# Patient Record
Sex: Female | Born: 1955
Health system: Southern US, Community
[De-identification: ages and names within clinical notes are randomized; demographics above are authoritative.]

## PROBLEM LIST (undated history)

## (undated) DIAGNOSIS — I10 Essential (primary) hypertension: Secondary | ICD-10-CM

## (undated) DIAGNOSIS — E079 Disorder of thyroid, unspecified: Secondary | ICD-10-CM

## (undated) DIAGNOSIS — K5792 Diverticulitis of intestine, part unspecified, without perforation or abscess without bleeding: Secondary | ICD-10-CM

## (undated) DIAGNOSIS — M1990 Unspecified osteoarthritis, unspecified site: Secondary | ICD-10-CM

## (undated) DIAGNOSIS — IMO0002 Reserved for concepts with insufficient information to code with codable children: Secondary | ICD-10-CM

## (undated) DIAGNOSIS — M8580 Other specified disorders of bone density and structure, unspecified site: Secondary | ICD-10-CM

## (undated) HISTORY — DX: Disorder of thyroid, unspecified: E07.9

## (undated) HISTORY — DX: Unspecified osteoarthritis, unspecified site: M19.90

## (undated) HISTORY — PX: GYNECOLOGIC CRYOSURGERY: SHX857

## (undated) HISTORY — DX: Reserved for concepts with insufficient information to code with codable children: IMO0002

## (undated) HISTORY — DX: Other specified disorders of bone density and structure, unspecified site: M85.80

## (undated) HISTORY — PX: OTHER SURGICAL HISTORY: SHX169

## (undated) HISTORY — PX: BREAST EXCISIONAL BIOPSY: SUR124

## (undated) HISTORY — PX: BREAST BIOPSY: SHX20

## (undated) HISTORY — PX: TUBAL LIGATION: SHX77

---

## 1985-02-28 HISTORY — PX: CERVIX SURGERY: SHX593

## 1993-02-28 HISTORY — PX: HYSTEROSCOPY WITH D & C: SHX1775

## 1993-02-28 HISTORY — PX: VAGINAL HYSTERECTOMY: SUR661

## 1997-11-07 ENCOUNTER — Ambulatory Visit (HOSPITAL_COMMUNITY): Admission: RE | Admit: 1997-11-07 | Discharge: 1997-11-07 | Payer: Self-pay | Admitting: Obstetrics and Gynecology

## 1998-11-23 ENCOUNTER — Ambulatory Visit (HOSPITAL_COMMUNITY): Admission: RE | Admit: 1998-11-23 | Discharge: 1998-11-23 | Payer: Self-pay | Admitting: Obstetrics and Gynecology

## 1998-11-23 ENCOUNTER — Encounter: Payer: Self-pay | Admitting: Obstetrics and Gynecology

## 1999-02-15 ENCOUNTER — Other Ambulatory Visit: Admission: RE | Admit: 1999-02-15 | Discharge: 1999-02-15 | Payer: Self-pay | Admitting: Obstetrics and Gynecology

## 1999-12-15 ENCOUNTER — Ambulatory Visit (HOSPITAL_COMMUNITY): Admission: RE | Admit: 1999-12-15 | Discharge: 1999-12-15 | Payer: Self-pay | Admitting: Obstetrics and Gynecology

## 1999-12-15 ENCOUNTER — Encounter: Payer: Self-pay | Admitting: Obstetrics and Gynecology

## 2000-04-10 ENCOUNTER — Encounter: Admission: RE | Admit: 2000-04-10 | Discharge: 2000-04-10 | Payer: Self-pay | Admitting: Obstetrics and Gynecology

## 2000-04-10 ENCOUNTER — Encounter: Payer: Self-pay | Admitting: Obstetrics and Gynecology

## 2001-02-28 HISTORY — PX: CERVICAL DISCECTOMY: SHX98

## 2001-08-30 ENCOUNTER — Encounter: Payer: Self-pay | Admitting: Internal Medicine

## 2001-08-30 ENCOUNTER — Encounter: Admission: RE | Admit: 2001-08-30 | Discharge: 2001-08-30 | Payer: Self-pay | Admitting: Internal Medicine

## 2002-01-20 ENCOUNTER — Encounter: Payer: Self-pay | Admitting: Emergency Medicine

## 2002-01-20 ENCOUNTER — Emergency Department (HOSPITAL_COMMUNITY): Admission: EM | Admit: 2002-01-20 | Discharge: 2002-01-20 | Payer: Self-pay | Admitting: Emergency Medicine

## 2002-09-09 ENCOUNTER — Encounter: Admission: RE | Admit: 2002-09-09 | Discharge: 2002-09-09 | Payer: Self-pay | Admitting: Internal Medicine

## 2002-09-09 ENCOUNTER — Encounter: Payer: Self-pay | Admitting: Internal Medicine

## 2003-07-17 ENCOUNTER — Other Ambulatory Visit: Admission: RE | Admit: 2003-07-17 | Discharge: 2003-07-17 | Payer: Self-pay | Admitting: Gynecology

## 2003-09-12 ENCOUNTER — Encounter: Admission: RE | Admit: 2003-09-12 | Discharge: 2003-09-12 | Payer: Self-pay | Admitting: Gynecology

## 2004-07-29 ENCOUNTER — Other Ambulatory Visit: Admission: RE | Admit: 2004-07-29 | Discharge: 2004-07-29 | Payer: Self-pay | Admitting: Gynecology

## 2004-11-23 ENCOUNTER — Encounter: Admission: RE | Admit: 2004-11-23 | Discharge: 2004-11-23 | Payer: Self-pay | Admitting: Internal Medicine

## 2005-02-27 ENCOUNTER — Encounter: Admission: RE | Admit: 2005-02-27 | Discharge: 2005-02-27 | Payer: Self-pay | Admitting: Internal Medicine

## 2005-06-29 ENCOUNTER — Encounter: Payer: Self-pay | Admitting: Obstetrics and Gynecology

## 2005-08-24 ENCOUNTER — Other Ambulatory Visit: Admission: RE | Admit: 2005-08-24 | Discharge: 2005-08-24 | Payer: Self-pay | Admitting: Gynecology

## 2006-01-09 ENCOUNTER — Ambulatory Visit (HOSPITAL_COMMUNITY): Admission: RE | Admit: 2006-01-09 | Discharge: 2006-01-09 | Payer: Self-pay | Admitting: *Deleted

## 2006-01-09 ENCOUNTER — Encounter: Admission: RE | Admit: 2006-01-09 | Discharge: 2006-01-09 | Payer: Self-pay | Admitting: Internal Medicine

## 2006-11-02 ENCOUNTER — Other Ambulatory Visit: Admission: RE | Admit: 2006-11-02 | Discharge: 2006-11-02 | Payer: Self-pay | Admitting: Gynecology

## 2007-01-22 ENCOUNTER — Encounter: Admission: RE | Admit: 2007-01-22 | Discharge: 2007-01-22 | Payer: Self-pay | Admitting: Internal Medicine

## 2007-01-30 ENCOUNTER — Encounter: Admission: RE | Admit: 2007-01-30 | Discharge: 2007-01-30 | Payer: Self-pay | Admitting: Internal Medicine

## 2008-05-08 ENCOUNTER — Encounter: Admission: RE | Admit: 2008-05-08 | Discharge: 2008-05-08 | Payer: Self-pay | Admitting: Internal Medicine

## 2009-01-08 ENCOUNTER — Other Ambulatory Visit: Admission: RE | Admit: 2009-01-08 | Discharge: 2009-01-08 | Payer: Self-pay | Admitting: Gynecology

## 2009-01-08 ENCOUNTER — Ambulatory Visit: Payer: Self-pay | Admitting: Gynecology

## 2009-01-08 ENCOUNTER — Encounter: Payer: Self-pay | Admitting: Gynecology

## 2009-01-15 ENCOUNTER — Encounter: Admission: RE | Admit: 2009-01-15 | Discharge: 2009-01-15 | Payer: Self-pay | Admitting: Internal Medicine

## 2010-01-13 ENCOUNTER — Ambulatory Visit: Payer: Self-pay | Admitting: Gynecology

## 2010-01-13 ENCOUNTER — Other Ambulatory Visit: Admission: RE | Admit: 2010-01-13 | Discharge: 2010-01-13 | Payer: Self-pay | Admitting: Gynecology

## 2010-01-20 ENCOUNTER — Encounter: Admission: RE | Admit: 2010-01-20 | Discharge: 2010-01-20 | Payer: Self-pay | Admitting: Gynecology

## 2010-03-20 ENCOUNTER — Encounter: Payer: Self-pay | Admitting: Internal Medicine

## 2010-03-21 ENCOUNTER — Encounter: Payer: Self-pay | Admitting: Internal Medicine

## 2010-07-16 NOTE — Op Note (Signed)
NAMESALENA, ORTLIEB                 ACCOUNT NO.:  [id number]   MEDICAL RECORD NO.:  [id number]          PATIENT TYPE:  AMB   LOCATION:  ENDO                         FACILITY:  [facility]   PHYSICIAN:  Georgiana Spinner, M.D.    DATE OF BIRTH:  Dec 30, 1955   DATE OF PROCEDURE:  DATE OF DISCHARGE:                                 OPERATIVE REPORT   PROCEDURE:  Colonoscopy.   INDICATIONS:  Colon cancer screening.   ANESTHESIA:  Fentanyl 100 mcg, Versed 7 mg.   PROCEDURE:  With the patient mildly sedated in the left lateral decubitus  position, the Olympus videoscopic colonoscope was inserted into the rectum  and passed through a rather tortuous colon to reach the cecum which was  identified by the ileocecal valve and crow's foot of the cecum, both of  which were photographed.  From this point, the colonoscope was slowly  withdrawn, taking circumferential views of the colonic mucosa, stopping only  in the rectum which appeared normal on direct and showed hemorrhoids on  retroflexed view.  The endoscope was straightened and withdrawn.  The  patient's vital signs and pulse oximeter remained stable.  The patient  tolerated procedure well without apparent complications.   FINDINGS:  Internal hemorrhoids.  Otherwise an unremarkable colonoscopic  examination to the cecum.   PLAN:  Consider repeat examination in 5-10 years.           ______________________________  Georgiana Spinner, M.D.     GMO/MEDQ  D:  01/09/2006  T:  01/09/2006  Job:  [id number]

## 2010-08-23 ENCOUNTER — Encounter (INDEPENDENT_AMBULATORY_CARE_PROVIDER_SITE_OTHER): Payer: Self-pay | Admitting: Surgery

## 2010-08-27 ENCOUNTER — Encounter (INDEPENDENT_AMBULATORY_CARE_PROVIDER_SITE_OTHER): Payer: 59 | Admitting: Surgery

## 2010-09-07 ENCOUNTER — Encounter (INDEPENDENT_AMBULATORY_CARE_PROVIDER_SITE_OTHER): Payer: Self-pay | Admitting: Surgery

## 2010-09-08 ENCOUNTER — Encounter (INDEPENDENT_AMBULATORY_CARE_PROVIDER_SITE_OTHER): Payer: Self-pay | Admitting: Surgery

## 2010-09-08 ENCOUNTER — Ambulatory Visit (INDEPENDENT_AMBULATORY_CARE_PROVIDER_SITE_OTHER): Payer: 59 | Admitting: Surgery

## 2010-09-08 DIAGNOSIS — D1739 Benign lipomatous neoplasm of skin and subcutaneous tissue of other sites: Secondary | ICD-10-CM

## 2010-09-08 DIAGNOSIS — D171 Benign lipomatous neoplasm of skin and subcutaneous tissue of trunk: Secondary | ICD-10-CM

## 2010-09-08 NOTE — Progress Notes (Signed)
HPI: The patient is a 55 year old white female who is a patient of Dr. Merri Brunette.  I removed a lipoma from her right back on 05 August 2010. She has done well.   PE: Back: Her incision is well healed without inflammation.  Assessement: #1. Lipoma of right back.  I gave the patient copy of her path report. Her return visit is on a p.r.n. basis.

## 2010-09-08 NOTE — Patient Instructions (Signed)
Has copy of pathology.

## 2010-12-13 ENCOUNTER — Other Ambulatory Visit: Payer: Self-pay | Admitting: Gynecology

## 2010-12-13 DIAGNOSIS — Z1231 Encounter for screening mammogram for malignant neoplasm of breast: Secondary | ICD-10-CM

## 2011-01-13 ENCOUNTER — Other Ambulatory Visit: Payer: Self-pay | Admitting: Orthopedic Surgery

## 2011-01-13 DIAGNOSIS — M4800 Spinal stenosis, site unspecified: Secondary | ICD-10-CM

## 2011-01-13 DIAGNOSIS — M5126 Other intervertebral disc displacement, lumbar region: Secondary | ICD-10-CM

## 2011-01-14 ENCOUNTER — Ambulatory Visit
Admission: RE | Admit: 2011-01-14 | Discharge: 2011-01-14 | Disposition: A | Discharge status: Home / Self Care | Payer: 59 | Source: Ambulatory Visit | Attending: Orthopedic Surgery | Admitting: Orthopedic Surgery

## 2011-01-14 DIAGNOSIS — M4800 Spinal stenosis, site unspecified: Secondary | ICD-10-CM

## 2011-01-14 DIAGNOSIS — M5126 Other intervertebral disc displacement, lumbar region: Secondary | ICD-10-CM

## 2011-01-14 MED ORDER — IOHEXOL 180 MG/ML  SOLN
15.0000 mL | Freq: Once | INTRAMUSCULAR | Status: AC | PRN
Start: 2011-01-14 — End: 2011-01-14

## 2011-01-14 MED ORDER — ONDANSETRON HCL 4 MG/2ML IJ SOLN
4.0000 mg | Freq: Once | INTRAMUSCULAR | Status: AC
  Administered 2011-01-14: 4 mg via INTRAMUSCULAR

## 2011-01-14 MED ORDER — HYDROMORPHONE HCL PF 2 MG/ML IJ SOLN
1.5000 mg | Freq: Once | INTRAMUSCULAR | Status: AC
  Administered 2011-01-14: 1.5 mg via INTRAMUSCULAR

## 2011-01-14 MED ORDER — DIAZEPAM 5 MG PO TABS
10.0000 mg | ORAL_TABLET | Freq: Once | ORAL | Status: AC
  Administered 2011-01-14: 10 mg via ORAL

## 2011-01-14 NOTE — Patient Instructions (Signed)

## 2011-01-25 ENCOUNTER — Ambulatory Visit
Admission: RE | Admit: 2011-01-25 | Discharge: 2011-01-25 | Disposition: A | Discharge status: Home / Self Care | Payer: 59 | Source: Ambulatory Visit | Attending: Gynecology | Admitting: Gynecology

## 2011-01-25 DIAGNOSIS — Z1231 Encounter for screening mammogram for malignant neoplasm of breast: Secondary | ICD-10-CM

## 2011-02-08 ENCOUNTER — Other Ambulatory Visit: Payer: Self-pay | Admitting: Orthopedic Surgery

## 2011-02-08 DIAGNOSIS — IMO0002 Reserved for concepts with insufficient information to code with codable children: Secondary | ICD-10-CM

## 2011-02-15 ENCOUNTER — Ambulatory Visit
Admission: RE | Admit: 2011-02-15 | Discharge: 2011-02-15 | Disposition: A | Discharge status: Home / Self Care | Payer: 59 | Source: Ambulatory Visit | Attending: Orthopedic Surgery | Admitting: Orthopedic Surgery

## 2011-02-15 DIAGNOSIS — IMO0002 Reserved for concepts with insufficient information to code with codable children: Secondary | ICD-10-CM

## 2011-02-15 MED ORDER — IOHEXOL 300 MG/ML  SOLN
100.0000 mL | Freq: Once | INTRAMUSCULAR | Status: AC | PRN
  Administered 2011-02-15: 100 mL via INTRAVENOUS

## 2011-08-16 ENCOUNTER — Encounter: Payer: Self-pay | Admitting: *Deleted

## 2011-08-24 ENCOUNTER — Ambulatory Visit (INDEPENDENT_AMBULATORY_CARE_PROVIDER_SITE_OTHER): Payer: 59 | Admitting: Gynecology

## 2011-08-24 ENCOUNTER — Encounter: Payer: Self-pay | Admitting: Gynecology

## 2011-08-24 VITALS — BP 116/70 | Ht 64.25 in | Wt 134.0 lb

## 2011-08-24 DIAGNOSIS — Z01419 Encounter for gynecological examination (general) (routine) without abnormal findings: Secondary | ICD-10-CM

## 2011-08-24 DIAGNOSIS — N952 Postmenopausal atrophic vaginitis: Secondary | ICD-10-CM

## 2011-08-24 NOTE — Progress Notes (Signed)
Sherry Pierce 10/10/1955 [id number]        56 y.o.  for annual exam.  Doing well without complaints.  Past medical history,surgical history, medications, allergies, family history and social history were all reviewed and documented in the EPIC chart. ROS:  Was performed and pertinent positives and negatives are included in the history.  Exam: Sherrilyn Rist assistant Filed Vitals:   08/24/11 1543  BP: 116/70   General appearance  Normal Skin grossly normal Head/Neck normal with no cervical or supraclavicular adenopathy thyroid normal Lungs  clear Cardiac RR, without RMG Abdominal  soft, nontender, without masses, organomegaly or hernia Breasts  examined lying and sitting without masses, retractions, discharge or axillary adenopathy. Pelvic  Ext/BUS/vagina  normal with atrophic genital changes.  Adnexa  Without masses or tenderness    Anus and perineum  normal   Rectovaginal  normal sphincter tone without palpated masses or tenderness.    Assessment/Plan:  56 y.o. female for annual exam.    1. Atrophic genital changes. Patient's overall he is symptomatic but she is not sexually active. She had questions about becoming such active and I reviewed options to include ERT both systemically as well as topically. The issues of thrombosis with stroke heart attack DVT and breast cancer issues reviewed and the options for vaginal cream with absorption versus Vagifem with limited absorption also discussed. She's not ready to make any decisions at this time and let me know if she wants to consider a trial of vaginal estrogen. She is not having significant hot flushes night sweats or other symptoms. 2. Pap smear. Patient has a history of cryosurgery a number of years ago with negative follow up Pap smears.  She subsequently underwent hysterectomy in 1995 for bleeding. Her last Pap smear was 2011 of the vaginal cuff and she has a number of negative vaginal Pap smears in her chart. I discussed current screening  guidelines and no Pap smear was done today and option to stop screening altogether was reviewed. We'll review this on an annual basis. 3. Mammography. Patient's mammography was November 2012. She'll continue with annual mammography. SBE monthly reviewed. 4. Bone density. She has done through Dr. Carolee Rota office who follows her. 5. Colonoscopy. Patient's up to date and will follow up with their recommended screening interval. 6. Health maintenance. No blood work was done today as it is all done through Dr. Carolee Rota office who sees her routinely for medical follow up. Assuming she continues well from a gynecologic standpoint she will see me in a year, sooner as needed.    Dara Lords MD, 4:11 PM 08/24/2011

## 2011-08-24 NOTE — Patient Instructions (Signed)
Follow up in one year for her annual gynecologic exam. 

## 2012-02-14 ENCOUNTER — Other Ambulatory Visit: Payer: Self-pay | Admitting: Gynecology

## 2012-02-14 DIAGNOSIS — Z1231 Encounter for screening mammogram for malignant neoplasm of breast: Secondary | ICD-10-CM

## 2012-02-16 ENCOUNTER — Ambulatory Visit
Admission: RE | Admit: 2012-02-16 | Discharge: 2012-02-16 | Disposition: A | Discharge status: Home / Self Care | Payer: 59 | Source: Ambulatory Visit | Attending: Gynecology | Admitting: Gynecology

## 2012-02-16 DIAGNOSIS — Z1231 Encounter for screening mammogram for malignant neoplasm of breast: Secondary | ICD-10-CM

## 2013-01-22 ENCOUNTER — Encounter: Payer: Self-pay | Admitting: Internal Medicine

## 2013-02-07 ENCOUNTER — Other Ambulatory Visit: Payer: Self-pay

## 2013-02-07 DIAGNOSIS — Z1231 Encounter for screening mammogram for malignant neoplasm of breast: Secondary | ICD-10-CM

## 2013-02-26 ENCOUNTER — Ambulatory Visit: Payer: 59 | Admitting: Internal Medicine

## 2013-03-04 ENCOUNTER — Other Ambulatory Visit: Payer: Self-pay | Admitting: Internal Medicine

## 2013-03-04 DIAGNOSIS — R1310 Dysphagia, unspecified: Secondary | ICD-10-CM

## 2013-03-05 ENCOUNTER — Ambulatory Visit: Payer: 59

## 2013-03-07 ENCOUNTER — Ambulatory Visit: Payer: 59 | Admitting: Internal Medicine

## 2013-03-18 ENCOUNTER — Ambulatory Visit
Admission: RE | Admit: 2013-03-18 | Discharge: 2013-03-18 | Disposition: A | Discharge status: Home / Self Care | Payer: 59 | Source: Ambulatory Visit | Attending: Internal Medicine | Admitting: Internal Medicine

## 2013-03-18 DIAGNOSIS — R1310 Dysphagia, unspecified: Secondary | ICD-10-CM

## 2013-03-22 ENCOUNTER — Ambulatory Visit
Admission: RE | Admit: 2013-03-22 | Discharge: 2013-03-22 | Disposition: A | Discharge status: Home / Self Care | Payer: 59 | Source: Ambulatory Visit

## 2013-03-22 DIAGNOSIS — Z1231 Encounter for screening mammogram for malignant neoplasm of breast: Secondary | ICD-10-CM

## 2013-12-30 ENCOUNTER — Encounter: Payer: Self-pay | Admitting: Gynecology

## 2014-03-05 ENCOUNTER — Other Ambulatory Visit: Payer: Self-pay

## 2014-03-05 DIAGNOSIS — Z1231 Encounter for screening mammogram for malignant neoplasm of breast: Secondary | ICD-10-CM

## 2014-03-28 ENCOUNTER — Ambulatory Visit: Payer: Self-pay

## 2014-04-04 ENCOUNTER — Ambulatory Visit
Admission: RE | Admit: 2014-04-04 | Discharge: 2014-04-04 | Disposition: A | Discharge status: Home / Self Care | Payer: 59 | Source: Ambulatory Visit

## 2014-04-04 DIAGNOSIS — Z1231 Encounter for screening mammogram for malignant neoplasm of breast: Secondary | ICD-10-CM

## 2015-07-15 ENCOUNTER — Other Ambulatory Visit: Payer: Self-pay

## 2015-07-15 DIAGNOSIS — Z1231 Encounter for screening mammogram for malignant neoplasm of breast: Secondary | ICD-10-CM

## 2015-07-31 ENCOUNTER — Ambulatory Visit
Admission: RE | Admit: 2015-07-31 | Discharge: 2015-07-31 | Disposition: A | Discharge status: Home / Self Care | Payer: 59 | Source: Ambulatory Visit

## 2015-07-31 DIAGNOSIS — Z1231 Encounter for screening mammogram for malignant neoplasm of breast: Secondary | ICD-10-CM

## 2016-05-02 DIAGNOSIS — Z79899 Other long term (current) drug therapy: Secondary | ICD-10-CM | POA: Diagnosis not present

## 2016-05-02 DIAGNOSIS — Z23 Encounter for immunization: Secondary | ICD-10-CM | POA: Diagnosis not present

## 2016-08-12 DIAGNOSIS — Z1211 Encounter for screening for malignant neoplasm of colon: Secondary | ICD-10-CM | POA: Diagnosis not present

## 2016-08-12 DIAGNOSIS — Z1212 Encounter for screening for malignant neoplasm of rectum: Secondary | ICD-10-CM | POA: Diagnosis not present

## 2016-08-23 ENCOUNTER — Encounter: Payer: Self-pay | Admitting: Gastroenterology

## 2016-09-09 DIAGNOSIS — R195 Other fecal abnormalities: Secondary | ICD-10-CM | POA: Diagnosis not present

## 2016-09-16 ENCOUNTER — Other Ambulatory Visit: Payer: Self-pay | Admitting: Internal Medicine

## 2016-09-16 DIAGNOSIS — Z1231 Encounter for screening mammogram for malignant neoplasm of breast: Secondary | ICD-10-CM

## 2016-09-23 DIAGNOSIS — E039 Hypothyroidism, unspecified: Secondary | ICD-10-CM | POA: Diagnosis not present

## 2016-09-23 DIAGNOSIS — Z0000 Encounter for general adult medical examination without abnormal findings: Secondary | ICD-10-CM | POA: Diagnosis not present

## 2016-09-26 ENCOUNTER — Ambulatory Visit
Admission: RE | Admit: 2016-09-26 | Discharge: 2016-09-26 | Disposition: A | Discharge status: Home / Self Care | Payer: 59 | Source: Ambulatory Visit | Attending: Internal Medicine | Admitting: Internal Medicine

## 2016-09-26 ENCOUNTER — Encounter (INDEPENDENT_AMBULATORY_CARE_PROVIDER_SITE_OTHER): Payer: Self-pay

## 2016-09-26 DIAGNOSIS — Z1231 Encounter for screening mammogram for malignant neoplasm of breast: Secondary | ICD-10-CM | POA: Diagnosis not present

## 2016-09-30 DIAGNOSIS — E039 Hypothyroidism, unspecified: Secondary | ICD-10-CM | POA: Diagnosis not present

## 2016-09-30 DIAGNOSIS — M859 Disorder of bone density and structure, unspecified: Secondary | ICD-10-CM | POA: Diagnosis not present

## 2016-09-30 DIAGNOSIS — Z0000 Encounter for general adult medical examination without abnormal findings: Secondary | ICD-10-CM | POA: Diagnosis not present

## 2016-09-30 DIAGNOSIS — M8580 Other specified disorders of bone density and structure, unspecified site: Secondary | ICD-10-CM | POA: Diagnosis not present

## 2016-10-04 DIAGNOSIS — K635 Polyp of colon: Secondary | ICD-10-CM | POA: Diagnosis not present

## 2016-10-04 DIAGNOSIS — R195 Other fecal abnormalities: Secondary | ICD-10-CM | POA: Diagnosis not present

## 2016-10-04 DIAGNOSIS — D126 Benign neoplasm of colon, unspecified: Secondary | ICD-10-CM | POA: Diagnosis not present

## 2016-10-05 DIAGNOSIS — Z23 Encounter for immunization: Secondary | ICD-10-CM | POA: Diagnosis not present

## 2016-10-11 ENCOUNTER — Ambulatory Visit: Payer: 59 | Admitting: Gastroenterology

## 2016-10-24 ENCOUNTER — Ambulatory Visit: Payer: Self-pay | Admitting: Gynecology

## 2016-12-21 ENCOUNTER — Emergency Department (HOSPITAL_COMMUNITY)
Admission: EM | Admit: 2016-12-21 | Discharge: 2016-12-21 | Disposition: A | Discharge status: Home / Self Care | Payer: 59 | Attending: Physician Assistant | Admitting: Physician Assistant

## 2016-12-21 ENCOUNTER — Encounter (HOSPITAL_COMMUNITY): Payer: Self-pay

## 2016-12-21 ENCOUNTER — Emergency Department (HOSPITAL_COMMUNITY): Payer: 59

## 2016-12-21 DIAGNOSIS — W0110XA Fall on same level from slipping, tripping and stumbling with subsequent striking against unspecified object, initial encounter: Secondary | ICD-10-CM | POA: Insufficient documentation

## 2016-12-21 DIAGNOSIS — S52512A Displaced fracture of left radial styloid process, initial encounter for closed fracture: Secondary | ICD-10-CM | POA: Diagnosis not present

## 2016-12-21 DIAGNOSIS — S52572A Other intraarticular fracture of lower end of left radius, initial encounter for closed fracture: Secondary | ICD-10-CM | POA: Diagnosis not present

## 2016-12-21 DIAGNOSIS — Y929 Unspecified place or not applicable: Secondary | ICD-10-CM | POA: Insufficient documentation

## 2016-12-21 DIAGNOSIS — Z79899 Other long term (current) drug therapy: Secondary | ICD-10-CM | POA: Insufficient documentation

## 2016-12-21 DIAGNOSIS — Y999 Unspecified external cause status: Secondary | ICD-10-CM | POA: Insufficient documentation

## 2016-12-21 DIAGNOSIS — Y9301 Activity, walking, marching and hiking: Secondary | ICD-10-CM | POA: Diagnosis not present

## 2016-12-21 DIAGNOSIS — Z87891 Personal history of nicotine dependence: Secondary | ICD-10-CM | POA: Diagnosis not present

## 2016-12-21 DIAGNOSIS — S59902A Unspecified injury of left elbow, initial encounter: Secondary | ICD-10-CM | POA: Diagnosis not present

## 2016-12-21 DIAGNOSIS — S52612A Displaced fracture of left ulna styloid process, initial encounter for closed fracture: Secondary | ICD-10-CM | POA: Diagnosis not present

## 2016-12-21 DIAGNOSIS — S6992XA Unspecified injury of left wrist, hand and finger(s), initial encounter: Secondary | ICD-10-CM | POA: Diagnosis present

## 2016-12-21 MED ORDER — FENTANYL CITRATE (PF) 100 MCG/2ML IJ SOLN
25.0000 ug | Freq: Once | INTRAMUSCULAR | Status: AC
  Administered 2016-12-21: 25 ug via INTRAMUSCULAR

## 2016-12-21 MED ORDER — OXYCODONE-ACETAMINOPHEN 5-325 MG PO TABS: 1.0000 | ORAL_TABLET | Freq: Four times a day (QID) | ORAL | 0 refills | Status: DC | PRN

## 2016-12-21 MED ORDER — FENTANYL CITRATE (PF) 100 MCG/2ML IJ SOLN
25.0000 ug | Freq: Once | INTRAMUSCULAR | Status: DC
  Filled 2016-12-21: qty 2

## 2016-12-21 MED ORDER — TETANUS-DIPHTH-ACELL PERTUSSIS 5-2.5-18.5 LF-MCG/0.5 IM SUSP
0.5000 mL | Freq: Once | INTRAMUSCULAR | Status: DC
  Filled 2016-12-21: qty 0.5

## 2016-12-21 MED ORDER — OXYCODONE-ACETAMINOPHEN 5-325 MG PO TABS
1.0000 | ORAL_TABLET | Freq: Once | ORAL | Status: AC
  Administered 2016-12-21: 1 via ORAL
  Filled 2016-12-21: qty 1

## 2016-12-21 NOTE — ED Triage Notes (Signed)
Pt reports that she was in the yard with her dog on a leash, and dog pulled her she fell on the ground. Pt has limited ROM and deformities noted in wrist region, with left forearm and elbow pain. . Pt states pain 10/10.

## 2016-12-21 NOTE — ED Provider Notes (Signed)
[facility] DEPT Provider Note   CSN: [id number] Arrival date & time: 12/21/16  1637     History   Chief Complaint Chief Complaint  Patient presents with  . Fall  . Arm Injury    HPI Sherry Pierce is a 61 y.o. right handed female presents the emergency department today after a fall.  Patient states that approximately 4 hours ago she was walking her dog who saw another individual and pulled the patient down, causing her to fall with an outstretched hand on left side, landing on the sidewalk.  She denies any head trauma, loss of consciousness.  She has not had any nausea or vomiting since the event.  No alcohol or drug use prior to the event.  She is now having left wrist and forearm pain.  There is an obvious deformity of the left wrist with swelling.  She denies any numbness or tingling.  There is no discoloration of the fingers or hand.  She denies any headache, visual changes, neck pain, shoulder pain.  Patient is not on any anticoagulation medicine.  Tetanus up-to-date.  HPI  Past Medical History:  Diagnosis Date  . Arthritis    lower back  . Degenerative disc disease   . Lipoma    rt side  . Osteopenia   . Thyroid disease     Patient Active Problem List   Diagnosis Date Noted  . Lipoma of flank 09/08/2010    Past Surgical History:  Procedure Laterality Date  . BREAST BIOPSY    . CERVICAL DISCECTOMY  2003  . CERVIX SURGERY  1987  . GYNECOLOGIC CRYOSURGERY    . HYSTEROSCOPY W/D&C  1995  . TUBAL LIGATION    . VAGINAL HYSTERECTOMY  1995   menorrhagia    OB History    Gravida Para Term Preterm AB Living   3 2     1 2    SAB TAB Ectopic Multiple Live Births   1               Home Medications    Prior to Admission medications   Medication Sig Start Date End Date Taking? Authorizing Provider  calcium-vitamin D (OSCAL WITH D) 500-200 MG-UNIT per tablet Take 2 tablets by mouth daily. Calcium Chews +D & K 2 tabs 1200/600mg  Daily      [provider]  ibuprofen (ADVIL,MOTRIN) 800 MG tablet Take 800 mg by mouth every 8 (eight) hours as needed.      [provider]  levothyroxine (SYNTHROID, LEVOTHROID) 100 MCG tablet Take 100 mcg by mouth daily.      [provider]  Pediatric Multivit-Minerals-C (KIDS GUMMY BEAR VITAMINS) CHEW Chew 4-5 each by mouth daily.      [provider]  traMADol (ULTRAM) 50 MG tablet Take 50 mg by mouth every 6 (six) hours as needed.      [provider]    Family History Family History  Problem Relation Age of Onset  . Hypertension Mother   . Rheum arthritis Mother   . Lupus Mother   . Hepatitis Mother        Hep C  . Parkinsonism Father   . Breast cancer Maternal Grandmother   . Cancer Maternal Grandmother        bone    Social History Social History  Substance Use Topics  . Smoking status: Former Research scientist (life sciences)  . Smokeless tobacco: Never Used  . Alcohol use Yes     Comment: not  often     Allergies   Codeine; Doxycycline; Clarithromycin; and Erythrocin   Review of Systems Review of Systems  All other systems reviewed and are negative.    Physical Exam Updated Vital Signs BP 106/86 (BP Location: Right Arm)   Pulse 89   Temp 98.3 F (36.8 C) (Oral)   Resp 20   Ht 5\' 4"  (1.626 m)   Wt 67.1 kg (148 lb)   SpO2 100%   BMI 25.40 kg/m   Physical Exam  Constitutional: She appears well-developed and well-nourished.  HENT:  Head: Normocephalic and atraumatic.  Right Ear: External ear normal.  Left Ear: External ear normal.  Eyes: Conjunctivae are normal. Right eye exhibits no discharge. Left eye exhibits no discharge. No scleral icterus.  Cardiovascular:  Pulses:      Radial pulses are 2+ on the right side, and 2+ on the left side.  Pulmonary/Chest: Effort normal. No respiratory distress.  Musculoskeletal:  Patient's left arm held with elbow in flexion and wrist in flexion.  There is a obvious deformity of the left wrist with  associated swelling.  There is no overlying skin injury.  No tenderness to palpation over the left shoulder.  Gross range of motion intact however limited on exam due to pain of lower arm.  Elbow range of motion full with extension but limited on flexion to 90 degrees.  No tenderness palpation of the radial head, medial epicondyle or lateral epicondyle.  There is tenderness palpation of the radius and ulna of the forearm.  She is unable to perform pronation or supination.  Significant tenderness to palpation over the distal radius and ulna of the wrist.  There is snuffbox tenderness to palpation.  She is able to move all digits grossly without difficulty.  Intact resisted extension of all fingers.  Radial artery 2+.  Refill less than 2 seconds for all fingers.  Neurovascularly intact for radial, ulnar and medial nerve distributions.  No numbness of the lower or upper arm on gross palpation.  Neurological: She is alert.  Speech clear. Follows commands. No facial droop. PERRLA. EOM grossly intact. CN III-XII grossly intact. Grossly moves all extremities without ataxia. Obvious pain to left arm due to injury with movement. Able and appropriate strength for age to upper and lower extremities bilaterally  Skin: No pallor.  Psychiatric: She has a normal mood and affect.  Nursing note and vitals reviewed.    ED Treatments / Results  Labs (all labs ordered are listed, but only abnormal results are displayed) Labs Reviewed - No data to display  EKG  EKG Interpretation None       Radiology No results found.  Procedures Procedures (including critical care time)  Medications Ordered in ED Medications  fentaNYL (SUBLIMAZE) injection 25 mcg (not administered)  Tdap (BOOSTRIX) injection 0.5 mL (not administered)     Initial Impression / Assessment and Plan / ED Course  I have reviewed the triage vital signs and the nursing notes.  Pertinent labs & imaging results that were available during my  care of the patient were reviewed by me and considered in my medical decision making (see chart for details).     61 year old female presents to the emergency department today for left (non-dominant) wrist pain.  This occurred after the patient fell while walking her dog.  There is no evidence of head injury.  There is obvious deformity of wrist on exam.  Patient is neurovascularly intact.  X-rays placed for hand, wrist, forearm, and elbow.  X-ray shows comminuted intra-articular distal left radius fracture with mild displacement and ulnar styloid fracture that is slightly displaced.  I consulted hand surgery and Dr.Coley advised velcro splint.  He stated that he will have his office call the patient in the morning and she should remain NPO after midnight with anticipation of possible surgery tomorrow.  Advised patient that she will need someone to drive her to her appointment as there is a chance she may have surgery tomorrow.  The patient was placed in a wrist splint and tolerated this.  Her pain was controlled in the emergency department.  I provided her with a short course of pain medication at home.  At the time of discharge the patient is neurovascularly intact.  Strict return precautions discussed.  The patient is agreeable with plan and appears safe for discharge.    Final Clinical Impressions(s) / ED Diagnoses   Final diagnoses:  Other closed intra-articular fracture of distal end of left radius, initial encounter  Closed displaced fracture of styloid process of left ulna, initial encounter    New Prescriptions Discharge Medication List as of 12/21/2016  9:30 PM    START taking these medications   Details  oxyCODONE-acetaminophen (PERCOCET/ROXICET) 5-325 MG tablet Take 1 tablet by mouth every 6 (six) hours as needed for severe pain., Starting Wed 12/21/2016, Print         Jillyn Ledger, PA-C 12/21/16 2335    Macarthur Critchley, MD 12/21/16 2352

## 2016-12-21 NOTE — Discharge Instructions (Signed)
Please read and follow all provided instructions.  You have been seen today for fall and wrist pain.   Tests performed today include: An x-ray of the affected area - Comminuted intra-articular distal left radial fracture and ulnar styloid fracture. Vital signs. See below for your results today.   I placed you in a brace/splint.  I spoken with the hand surgeon about your case.  He is aware.  He advised that you do not eat or drink after midnight.  His office will call you around 830 tomorrow morning in order for you to be seen.  You should expect that there is a chance she may have surgery tomorrow so you should have somebody to drive you to and from your appointment  Home care instructions: -- *PRICE in the first 24-48 hours after injury: Protect (with brace, splint, sling), if given by your provider Rest Ice- Do not apply ice pack directly to your skin, place towel or similar between your skin and ice/ice pack. Apply ice for 20 min, then remove for 40 min while awake Compression- Wear brace, elastic bandage, splint as directed by your provider Elevate affected extremity above the level of your heart when not walking around for the first 24-48 hours   For pain control you may take: 800mg  of ibuprofen (that is usually four 200mg  over the counter pills) up to 3 times a day (please take with food) and acetaminophen 975mg  (this is 3 normal strength, 325mg , over the counter pills) up to four times a day. Please do not take more than this. Do not drink alcohol or combine with other medications that have acetaminophen or Ibuprofen as an ingredient (Read the labels!).   For breakthrough pain you may take Percocet (be advised this has tylenol in it, do not exceed above amount of total tylenol in one day). Do not drink alcohol drive or operate heavy machinery when taking. You are being provided a prescription for opiates (also known as narcotics) for pain control on an ?as needed? basis.  Opiates can be  addictive and should only be used when absolutely necessary for pain control when other alternatives do not work.  We recommend you only use them for the recommended amount of time and only as prescribed.  Please do not take with other sedative medications or alcohol.  Please do not drive, operate machinery, or make important decisions while taking opiates.  Please note that these medications can be addictive and have high abuse potential.  Please keep these medications locked away from children, teenagers or any family members with history of substance abuse. Additionally, these medications may cause constipation - take over the counter stool softeners or add fiber to your diet to treat this (Metamucil, Psyllium Fiber, Colace, Miralax) Further refills will need to be obtained from your primary care doctor and will not be prescribed through the Emergency Department. You will test positive on most drug tests while taking this medication.    Return instructions:  Please return if your toes or feet are numb or tingling, appear gray or blue, or you have severe pain (also elevate the leg and loosen splint or wrap if you were given one) Please return to the Emergency Department if you experience worsening symptoms.  Please return if you have any other emergent concerns. Additional Information:  Your vital signs today were: BP 126/83    Pulse 86    Temp 98.3 F (36.8 C) (Oral)    Resp 20    Ht 5\' 4"  (  1.626 m)    Wt 67.1 kg (148 lb)    SpO2 95%    BMI 25.40 kg/m  If your blood pressure (BP) was elevated above 135/85 this visit, please have this repeated by your doctor within one month. ---------------

## 2016-12-22 DIAGNOSIS — S52532B Colles' fracture of left radius, initial encounter for open fracture type I or II: Secondary | ICD-10-CM | POA: Diagnosis not present

## 2016-12-22 DIAGNOSIS — M25532 Pain in left wrist: Secondary | ICD-10-CM | POA: Diagnosis not present

## 2016-12-22 DIAGNOSIS — S52532A Colles' fracture of left radius, initial encounter for closed fracture: Secondary | ICD-10-CM | POA: Diagnosis not present

## 2017-01-04 DIAGNOSIS — S52532D Colles' fracture of left radius, subsequent encounter for closed fracture with routine healing: Secondary | ICD-10-CM | POA: Diagnosis not present

## 2017-01-16 DIAGNOSIS — S52532D Colles' fracture of left radius, subsequent encounter for closed fracture with routine healing: Secondary | ICD-10-CM | POA: Diagnosis not present

## 2017-02-15 DIAGNOSIS — S52532D Colles' fracture of left radius, subsequent encounter for closed fracture with routine healing: Secondary | ICD-10-CM | POA: Diagnosis not present

## 2017-05-16 DIAGNOSIS — R030 Elevated blood-pressure reading, without diagnosis of hypertension: Secondary | ICD-10-CM | POA: Diagnosis not present

## 2017-08-24 ENCOUNTER — Other Ambulatory Visit: Payer: Self-pay | Admitting: Internal Medicine

## 2017-08-24 DIAGNOSIS — Z1231 Encounter for screening mammogram for malignant neoplasm of breast: Secondary | ICD-10-CM

## 2017-09-08 DIAGNOSIS — M2550 Pain in unspecified joint: Secondary | ICD-10-CM | POA: Diagnosis not present

## 2017-10-02 DIAGNOSIS — E039 Hypothyroidism, unspecified: Secondary | ICD-10-CM | POA: Diagnosis not present

## 2017-10-02 DIAGNOSIS — Z0000 Encounter for general adult medical examination without abnormal findings: Secondary | ICD-10-CM | POA: Diagnosis not present

## 2017-10-13 ENCOUNTER — Ambulatory Visit
Admission: RE | Admit: 2017-10-13 | Discharge: 2017-10-13 | Disposition: A | Discharge status: Home / Self Care | Payer: 59 | Source: Ambulatory Visit | Attending: Internal Medicine | Admitting: Internal Medicine

## 2017-10-13 DIAGNOSIS — Z1231 Encounter for screening mammogram for malignant neoplasm of breast: Secondary | ICD-10-CM

## 2017-10-25 DIAGNOSIS — Z23 Encounter for immunization: Secondary | ICD-10-CM | POA: Diagnosis not present

## 2017-10-25 DIAGNOSIS — Z0000 Encounter for general adult medical examination without abnormal findings: Secondary | ICD-10-CM | POA: Diagnosis not present

## 2018-05-03 DIAGNOSIS — Z23 Encounter for immunization: Secondary | ICD-10-CM | POA: Diagnosis not present

## 2018-05-03 DIAGNOSIS — F988 Other specified behavioral and emotional disorders with onset usually occurring in childhood and adolescence: Secondary | ICD-10-CM | POA: Diagnosis not present

## 2018-05-03 DIAGNOSIS — R05 Cough: Secondary | ICD-10-CM | POA: Diagnosis not present

## 2018-05-03 DIAGNOSIS — Z6827 Body mass index (BMI) 27.0-27.9, adult: Secondary | ICD-10-CM | POA: Diagnosis not present

## 2018-08-01 DIAGNOSIS — Z23 Encounter for immunization: Secondary | ICD-10-CM | POA: Diagnosis not present

## 2018-08-01 DIAGNOSIS — F988 Other specified behavioral and emotional disorders with onset usually occurring in childhood and adolescence: Secondary | ICD-10-CM | POA: Diagnosis not present

## 2018-08-02 DIAGNOSIS — Z20828 Contact with and (suspected) exposure to other viral communicable diseases: Secondary | ICD-10-CM | POA: Diagnosis not present

## 2018-08-07 DIAGNOSIS — B029 Zoster without complications: Secondary | ICD-10-CM | POA: Diagnosis not present

## 2018-09-04 DIAGNOSIS — M25561 Pain in right knee: Secondary | ICD-10-CM | POA: Diagnosis not present

## 2018-09-04 DIAGNOSIS — M25562 Pain in left knee: Secondary | ICD-10-CM | POA: Diagnosis not present

## 2018-09-12 DIAGNOSIS — M25561 Pain in right knee: Secondary | ICD-10-CM | POA: Diagnosis not present

## 2018-09-17 DIAGNOSIS — M25561 Pain in right knee: Secondary | ICD-10-CM | POA: Diagnosis not present

## 2018-09-17 DIAGNOSIS — S83231A Complex tear of medial meniscus, current injury, right knee, initial encounter: Secondary | ICD-10-CM | POA: Diagnosis not present

## 2018-10-18 DIAGNOSIS — M1711 Unilateral primary osteoarthritis, right knee: Secondary | ICD-10-CM | POA: Diagnosis not present

## 2018-10-18 DIAGNOSIS — M25561 Pain in right knee: Secondary | ICD-10-CM | POA: Diagnosis not present

## 2018-10-18 DIAGNOSIS — S83241A Other tear of medial meniscus, current injury, right knee, initial encounter: Secondary | ICD-10-CM | POA: Diagnosis not present

## 2018-10-18 DIAGNOSIS — S83281A Other tear of lateral meniscus, current injury, right knee, initial encounter: Secondary | ICD-10-CM | POA: Diagnosis not present

## 2018-10-25 DIAGNOSIS — M8589 Other specified disorders of bone density and structure, multiple sites: Secondary | ICD-10-CM | POA: Diagnosis not present

## 2018-10-25 DIAGNOSIS — E039 Hypothyroidism, unspecified: Secondary | ICD-10-CM | POA: Diagnosis not present

## 2018-10-25 DIAGNOSIS — E559 Vitamin D deficiency, unspecified: Secondary | ICD-10-CM | POA: Diagnosis not present

## 2018-10-25 DIAGNOSIS — Z0000 Encounter for general adult medical examination without abnormal findings: Secondary | ICD-10-CM | POA: Diagnosis not present

## 2018-10-30 DIAGNOSIS — Z780 Asymptomatic menopausal state: Secondary | ICD-10-CM | POA: Diagnosis not present

## 2018-10-30 DIAGNOSIS — Z23 Encounter for immunization: Secondary | ICD-10-CM | POA: Diagnosis not present

## 2018-10-30 DIAGNOSIS — F988 Other specified behavioral and emotional disorders with onset usually occurring in childhood and adolescence: Secondary | ICD-10-CM | POA: Diagnosis not present

## 2018-10-30 DIAGNOSIS — M8580 Other specified disorders of bone density and structure, unspecified site: Secondary | ICD-10-CM | POA: Diagnosis not present

## 2018-10-30 DIAGNOSIS — Z01818 Encounter for other preprocedural examination: Secondary | ICD-10-CM | POA: Diagnosis not present

## 2018-10-30 DIAGNOSIS — E039 Hypothyroidism, unspecified: Secondary | ICD-10-CM | POA: Diagnosis not present

## 2018-10-30 HISTORY — PX: OTHER SURGICAL HISTORY: SHX169

## 2018-11-06 ENCOUNTER — Other Ambulatory Visit: Payer: Self-pay | Admitting: Internal Medicine

## 2018-11-06 DIAGNOSIS — Z1231 Encounter for screening mammogram for malignant neoplasm of breast: Secondary | ICD-10-CM

## 2018-11-07 ENCOUNTER — Ambulatory Visit
Admission: RE | Admit: 2018-11-07 | Discharge: 2018-11-07 | Disposition: A | Discharge status: Home / Self Care | Payer: BC Managed Care – PPO | Source: Ambulatory Visit | Attending: Internal Medicine | Admitting: Internal Medicine

## 2018-11-07 ENCOUNTER — Other Ambulatory Visit: Payer: Self-pay

## 2018-11-07 DIAGNOSIS — Z1231 Encounter for screening mammogram for malignant neoplasm of breast: Secondary | ICD-10-CM

## 2018-11-12 ENCOUNTER — Other Ambulatory Visit: Payer: Self-pay

## 2018-11-13 ENCOUNTER — Ambulatory Visit (INDEPENDENT_AMBULATORY_CARE_PROVIDER_SITE_OTHER): Payer: BC Managed Care – PPO | Admitting: Gynecology

## 2018-11-13 ENCOUNTER — Encounter: Payer: Self-pay | Admitting: Gynecology

## 2018-11-13 VITALS — BP 120/76 | Ht 64.0 in | Wt 152.0 lb

## 2018-11-13 DIAGNOSIS — N952 Postmenopausal atrophic vaginitis: Secondary | ICD-10-CM

## 2018-11-13 DIAGNOSIS — Z01419 Encounter for gynecological examination (general) (routine) without abnormal findings: Secondary | ICD-10-CM | POA: Diagnosis not present

## 2018-11-13 NOTE — Addendum Note (Signed)
Addended by: Nelva Nay on: 11/13/2018 12:02 PM   Modules accepted: Orders

## 2018-11-13 NOTE — Progress Notes (Signed)
    Sherry Pierce 10/31/1955 [id number]        63 y.o.  [id number] for annual gynecologic exam.  Has not been in the office for a number of years.  Without gynecologic complaints.  Past medical history,surgical history, problem list, medications, allergies, family history and social history were all reviewed and documented as reviewed in the EPIC chart.  ROS:  Performed with pertinent positives and negatives included in the history, assessment and plan.   Additional significant findings : None   Exam: Caryn Bee assistant Vitals:   11/13/18 1128  BP: 120/76  Weight: 152 lb (68.9 kg)  Height: 5\' 4"  (1.626 m)   Body mass index is 26.09 kg/m.  General appearance:  Normal affect, orientation and appearance. Skin: Grossly normal HEENT: Without gross lesions.  No cervical or supraclavicular adenopathy. Thyroid normal.  Lungs:  Clear without wheezing, rales or rhonchi Cardiac: RR, without RMG Abdominal:  Soft, nontender, without masses, guarding, rebound, organomegaly or hernia Breasts:  Examined lying and sitting without masses, retractions, discharge or axillary adenopathy. Pelvic:  Ext, BUS, Vagina: With atrophic changes.  Pap smear of vaginal cuff done  Adnexa: Without masses or tenderness    Anus and perineum: Normal   Rectovaginal: Normal sphincter tone without palpated masses or tenderness.    Assessment/Plan:  63 y.o. [id number] female for annual gynecologic exam.  Status post TVH for bleeding in the past.  1. Postmenopausal.  Without significant menopausal symptoms. 2. Pap smear 2011.  Pap smear done today.  History of cryosurgery a number of years ago options to stop screening per current screening guidelines based on hysterectomy history reviewed.  Will readdress on an annual basis. 3. Mammography last week.  Breast exam normal today. 4. DEXA 2020 through Dr. Pennie Banter office.  We will continue to follow-up with them in reference to bone health. 5. Colonoscopy 2017.  Repeat  at their recommended interval. 6. Health maintenance.  No routine lab work done as patient does this through Dr. Pennie Banter office.  Follow-up 1 year, sooner as needed.   Anastasio Auerbach MD, 11:52 AM 11/13/2018

## 2018-11-13 NOTE — Patient Instructions (Signed)
Follow-up in 1 year for annual exam 

## 2018-11-14 DIAGNOSIS — S83241A Other tear of medial meniscus, current injury, right knee, initial encounter: Secondary | ICD-10-CM | POA: Diagnosis not present

## 2018-11-14 DIAGNOSIS — S83281A Other tear of lateral meniscus, current injury, right knee, initial encounter: Secondary | ICD-10-CM | POA: Diagnosis not present

## 2018-11-14 DIAGNOSIS — M23200 Derangement of unspecified lateral meniscus due to old tear or injury, right knee: Secondary | ICD-10-CM | POA: Diagnosis not present

## 2018-11-14 DIAGNOSIS — M23221 Derangement of posterior horn of medial meniscus due to old tear or injury, right knee: Secondary | ICD-10-CM | POA: Diagnosis not present

## 2018-11-14 DIAGNOSIS — M94261 Chondromalacia, right knee: Secondary | ICD-10-CM | POA: Diagnosis not present

## 2018-11-14 LAB — PAP IG W/ RFLX HPV ASCU

## 2018-11-27 ENCOUNTER — Encounter: Payer: Self-pay | Admitting: Gynecology

## 2018-12-19 DIAGNOSIS — R110 Nausea: Secondary | ICD-10-CM | POA: Diagnosis not present

## 2018-12-19 DIAGNOSIS — K5732 Diverticulitis of large intestine without perforation or abscess without bleeding: Secondary | ICD-10-CM | POA: Diagnosis not present

## 2018-12-19 DIAGNOSIS — R1084 Generalized abdominal pain: Secondary | ICD-10-CM | POA: Diagnosis not present

## 2018-12-19 DIAGNOSIS — I10 Essential (primary) hypertension: Secondary | ICD-10-CM | POA: Diagnosis not present

## 2018-12-19 DIAGNOSIS — Z881 Allergy status to other antibiotic agents status: Secondary | ICD-10-CM | POA: Diagnosis not present

## 2018-12-19 DIAGNOSIS — Z882 Allergy status to sulfonamides status: Secondary | ICD-10-CM | POA: Diagnosis not present

## 2018-12-20 DIAGNOSIS — K5732 Diverticulitis of large intestine without perforation or abscess without bleeding: Secondary | ICD-10-CM | POA: Diagnosis not present

## 2019-01-01 DIAGNOSIS — M25561 Pain in right knee: Secondary | ICD-10-CM | POA: Diagnosis not present

## 2019-01-01 DIAGNOSIS — M25661 Stiffness of right knee, not elsewhere classified: Secondary | ICD-10-CM | POA: Diagnosis not present

## 2019-01-03 DIAGNOSIS — M25561 Pain in right knee: Secondary | ICD-10-CM | POA: Diagnosis not present

## 2019-01-03 DIAGNOSIS — M25661 Stiffness of right knee, not elsewhere classified: Secondary | ICD-10-CM | POA: Diagnosis not present

## 2019-01-08 DIAGNOSIS — M25561 Pain in right knee: Secondary | ICD-10-CM | POA: Diagnosis not present

## 2019-01-08 DIAGNOSIS — M25661 Stiffness of right knee, not elsewhere classified: Secondary | ICD-10-CM | POA: Diagnosis not present

## 2019-01-11 DIAGNOSIS — M25561 Pain in right knee: Secondary | ICD-10-CM | POA: Diagnosis not present

## 2019-01-11 DIAGNOSIS — M25661 Stiffness of right knee, not elsewhere classified: Secondary | ICD-10-CM | POA: Diagnosis not present

## 2019-01-16 DIAGNOSIS — M25561 Pain in right knee: Secondary | ICD-10-CM | POA: Diagnosis not present

## 2019-01-16 DIAGNOSIS — M25661 Stiffness of right knee, not elsewhere classified: Secondary | ICD-10-CM | POA: Diagnosis not present

## 2019-01-22 ENCOUNTER — Other Ambulatory Visit: Payer: Self-pay | Admitting: Orthopedic Surgery

## 2019-01-22 ENCOUNTER — Telehealth: Payer: Self-pay | Admitting: Nurse Practitioner

## 2019-01-22 DIAGNOSIS — M25661 Stiffness of right knee, not elsewhere classified: Secondary | ICD-10-CM | POA: Diagnosis not present

## 2019-01-22 DIAGNOSIS — M545 Low back pain: Secondary | ICD-10-CM | POA: Diagnosis not present

## 2019-01-22 DIAGNOSIS — M25561 Pain in right knee: Secondary | ICD-10-CM | POA: Diagnosis not present

## 2019-01-22 DIAGNOSIS — G8929 Other chronic pain: Secondary | ICD-10-CM

## 2019-01-22 DIAGNOSIS — Z5189 Encounter for other specified aftercare: Secondary | ICD-10-CM | POA: Diagnosis not present

## 2019-01-22 NOTE — Telephone Encounter (Signed)
Phone call to patient to verify medication list and allergies for myelogram procedure. Pt instructed to hold vyvanse for 48hrs prior to myelogram appointment time. Pt verbalized understanding. Pre and post procedure instructions reviewed with pt.

## 2019-01-31 ENCOUNTER — Other Ambulatory Visit: Payer: Self-pay

## 2019-01-31 ENCOUNTER — Ambulatory Visit
Admission: RE | Admit: 2019-01-31 | Discharge: 2019-01-31 | Disposition: A | Discharge status: Home / Self Care | Payer: BC Managed Care – PPO | Source: Ambulatory Visit | Attending: Orthopedic Surgery | Admitting: Orthopedic Surgery

## 2019-01-31 DIAGNOSIS — M545 Low back pain, unspecified: Secondary | ICD-10-CM

## 2019-01-31 DIAGNOSIS — G8929 Other chronic pain: Secondary | ICD-10-CM

## 2019-01-31 DIAGNOSIS — M5126 Other intervertebral disc displacement, lumbar region: Secondary | ICD-10-CM | POA: Diagnosis not present

## 2019-01-31 MED ORDER — IOPAMIDOL (ISOVUE-M 200) INJECTION 41%
18.0000 mL | Freq: Once | INTRAMUSCULAR | Status: AC
  Administered 2019-01-31: 18 mL via INTRATHECAL

## 2019-01-31 MED ORDER — DIAZEPAM 5 MG PO TABS
5.0000 mg | ORAL_TABLET | Freq: Once | ORAL | Status: AC
  Administered 2019-01-31: 5 mg via ORAL

## 2019-01-31 NOTE — Discharge Instructions (Signed)
Myelogram Discharge Instructions  1. Go home and rest quietly for the next 24 hours.  It is important to lie flat for the next 24 hours.  Get up only to go to the restroom.  You may lie in the bed or on a couch on your back, your stomach, your left side or your right side.  You may have one pillow under your head.  You may have pillows between your knees while you are on your side or under your knees while you are on your back.  2. DO NOT drive today.  Recline the seat as far back as it will go, while still wearing your seat belt, on the way home.  3. You may get up to go to the bathroom as needed.  You may sit up for 10 minutes to eat.  You may resume your normal diet and medications unless otherwise indicated.  Drink lots of extra fluids today and tomorrow.  4. The incidence of headache, nausea, or vomiting is about 5% (one in 20 patients).  If you develop a headache, lie flat and drink plenty of fluids until the headache goes away.  Caffeinated beverages may be helpful.  If you develop severe nausea and vomiting or a headache that does not go away with flat bed rest, call [phone].  5. You may resume normal activities after your 24 hours of bed rest is over; however, do not exert yourself strongly or do any heavy lifting tomorrow. If when you get up you have a headache when standing, go back to bed and force fluids for another 24 hours.  6. Call your physician for a follow-up appointment.  The results of your myelogram will be sent directly to your physician by the following day.  7. If you have any questions or if complications develop after you arrive home, please call [phone].  Discharge instructions have been explained to the patient.  The patient, or the person responsible for the patient, fully understands these instructions.  YOU MAY RESTART YOUR VYVANSE TOMORROW 02/01/2019 AT 09:30AM.

## 2019-02-01 DIAGNOSIS — M21371 Foot drop, right foot: Secondary | ICD-10-CM | POA: Diagnosis not present

## 2019-02-01 DIAGNOSIS — M545 Low back pain: Secondary | ICD-10-CM | POA: Diagnosis not present

## 2019-02-01 DIAGNOSIS — M25561 Pain in right knee: Secondary | ICD-10-CM | POA: Diagnosis not present

## 2019-02-02 DIAGNOSIS — M545 Low back pain: Secondary | ICD-10-CM | POA: Diagnosis not present

## 2019-02-05 DIAGNOSIS — M5416 Radiculopathy, lumbar region: Secondary | ICD-10-CM | POA: Diagnosis not present

## 2019-02-05 DIAGNOSIS — M545 Low back pain: Secondary | ICD-10-CM | POA: Diagnosis not present

## 2019-02-05 DIAGNOSIS — M4180 Other forms of scoliosis, site unspecified: Secondary | ICD-10-CM | POA: Diagnosis not present

## 2019-02-06 DIAGNOSIS — Z01818 Encounter for other preprocedural examination: Secondary | ICD-10-CM | POA: Diagnosis not present

## 2019-02-11 ENCOUNTER — Ambulatory Visit: Payer: Self-pay | Admitting: Orthopedic Surgery

## 2019-02-18 NOTE — Pre-Procedure Instructions (Signed)
Sherry Pierce  02/18/2019      [place] [address] [place] [address] [place] [address] Phone: [phone] Fax: [phone]    Your procedure is scheduled on February 27, 2019.  Report to [facility] Entrance "A" at 900 AM.  Call this number if you have problems the morning of surgery:  [phone]   Remember:  Do not eat or drink after midnight.    Take these medicines the morning of surgery with A SIP OF WATER  Gabapentin (Neurontin) Levothyroxine (Synthroid)  Do Not take Vyvanse the day of surgery  7 days prior to surgery STOP taking any Aspirin (unless otherwise instructed by your surgeon), Aleve, Naproxen, Ibuprofen, Motrin, Advil, Goody's, BC's, all herbal medications, fish oil, and all vitamins    Day of surgery:  Do not wear jewelry, make-up or nail polish.  Do not wear lotions, powders, or perfumes, or deodorant.  Do not shave 48 hours prior to surgery.    Do not bring valuables to the hospital.  [facility] is not responsible for any belongings or valuables.  Contacts, dentures or bridgework may not be worn into surgery.  Leave your suitcase in the car.  After surgery it may be brought to your room.  For patients admitted to the hospital, discharge time will be determined by your treatment team.  Patients discharged the day of surgery will not be allowed to drive home.    [place]- Preparing For Surgery  Before surgery, you can play an important role. Because skin is not sterile, your skin needs to be as free of germs as possible. You can reduce the number of germs on your skin by washing with CHG (chlorahexidine gluconate) Soap before surgery.  CHG is an antiseptic cleaner which kills germs and bonds with the skin to continue killing germs even after washing.    Oral Hygiene is also important to reduce your risk of infection.  Remember - BRUSH YOUR TEETH THE MORNING OF SURGERY WITH YOUR  REGULAR TOOTHPASTE  Please do not use if you have an allergy to CHG or antibacterial soaps. If your skin becomes reddened/irritated stop using the CHG.  Do not shave (including legs and underarms) for at least 48 hours prior to first CHG shower. It is OK to shave your face.  Please follow these instructions carefully.   1. Shower the NIGHT BEFORE SURGERY and the MORNING OF SURGERY with CHG.   2. If you chose to wash your hair, wash your hair first as usual with your normal shampoo.  3. After you shampoo, rinse your hair and body thoroughly to remove the shampoo.  4. Use CHG as you would any other liquid soap. You can apply CHG directly to the skin and wash gently with a scrungie or a clean washcloth.   5. Apply the CHG Soap to your body ONLY FROM THE NECK DOWN.  Do not use on open wounds or open sores. Avoid contact with your eyes, ears, mouth and genitals (private parts). Wash Face and genitals (private parts)  with your normal soap.  6. Wash thoroughly, paying special attention to the area where your surgery will be performed.  7. Thoroughly rinse your body with warm water from the neck down.  8. DO NOT shower/wash with your normal soap after using and rinsing off the CHG Soap.  9. Pat yourself dry with a CLEAN TOWEL.  10. Wear CLEAN PAJAMAS to bed  the night before surgery, wear comfortable clothes the morning of surgery  11. Place CLEAN SHEETS on your bed the night of your first shower and DO NOT SLEEP WITH PETS.  Day of Surgery: Shower as above Do not apply any deodorants/lotions.  Please wear clean clothes to the hospital/surgery center.   Remember to brush your teeth WITH YOUR REGULAR TOOTHPASTE.   Please read over the following fact sheets that you were given.

## 2019-02-19 ENCOUNTER — Ambulatory Visit (HOSPITAL_COMMUNITY)
Admission: RE | Admit: 2019-02-19 | Discharge: 2019-02-19 | Disposition: A | Discharge status: Home / Self Care | Payer: BC Managed Care – PPO | Source: Ambulatory Visit | Attending: Orthopedic Surgery | Admitting: Orthopedic Surgery

## 2019-02-19 ENCOUNTER — Encounter (HOSPITAL_COMMUNITY)
Admission: RE | Admit: 2019-02-19 | Discharge: 2019-02-19 | Disposition: A | Discharge status: Home / Self Care | Payer: BC Managed Care – PPO | Source: Ambulatory Visit | Attending: Orthopedic Surgery | Admitting: Orthopedic Surgery

## 2019-02-19 ENCOUNTER — Ambulatory Visit: Payer: Self-pay | Admitting: Orthopedic Surgery

## 2019-02-19 ENCOUNTER — Encounter (HOSPITAL_COMMUNITY): Payer: Self-pay

## 2019-02-19 ENCOUNTER — Other Ambulatory Visit: Payer: Self-pay

## 2019-02-19 DIAGNOSIS — Z01818 Encounter for other preprocedural examination: Secondary | ICD-10-CM

## 2019-02-19 DIAGNOSIS — M5416 Radiculopathy, lumbar region: Secondary | ICD-10-CM | POA: Diagnosis not present

## 2019-02-19 HISTORY — DX: Essential (primary) hypertension: I10

## 2019-02-19 HISTORY — DX: Diverticulitis of intestine, part unspecified, without perforation or abscess without bleeding: K57.92

## 2019-02-19 LAB — CBC
HCT: 44.6 % (ref 36.0–46.0)
Hemoglobin: 14.5 g/dL (ref 12.0–15.0)
MCH: 29.1 pg (ref 26.0–34.0)
MCHC: 32.5 g/dL (ref 30.0–36.0)
MCV: 89.4 fL (ref 80.0–100.0)
Platelets: 324 10*3/uL (ref 150–400)
RBC: 4.99 MIL/uL (ref 3.87–5.11)
RDW: 13.2 % (ref 11.5–15.5)
WBC: 5.6 10*3/uL (ref 4.0–10.5)
nRBC: 0 % (ref 0.0–0.2)

## 2019-02-19 LAB — BASIC METABOLIC PANEL
Anion gap: 7 (ref 5–15)
BUN: 14 mg/dL (ref 8–23)
CO2: 28 mmol/L (ref 22–32)
Calcium: 9.5 mg/dL (ref 8.9–10.3)
Chloride: 105 mmol/L (ref 98–111)
Creatinine, Ser: 0.77 mg/dL (ref 0.44–1.00)
GFR calc Af Amer: >60 mL/min (ref >60)
GFR calc non Af Amer: >60 mL/min (ref >60)
Glucose, Bld: 76 mg/dL (ref 70–99)
Potassium: 3.7 mmol/L (ref 3.5–5.1)
Sodium: 140 mmol/L (ref 135–145)

## 2019-02-19 LAB — URINALYSIS, ROUTINE W REFLEX MICROSCOPIC
Bilirubin Urine: NEGATIVE
Glucose, UA: NEGATIVE mg/dL
Hgb urine dipstick: NEGATIVE
Ketones, ur: NEGATIVE mg/dL
Leukocytes,Ua: NEGATIVE
Nitrite: NEGATIVE
Protein, ur: NEGATIVE mg/dL
Specific Gravity, Urine: 1.016 (ref 1.005–1.030)
pH: 8 (ref 5.0–8.0)

## 2019-02-19 LAB — SURGICAL PCR SCREEN
MRSA, PCR: NEGATIVE
Staphylococcus aureus: POSITIVE — AB

## 2019-02-19 LAB — PROTIME-INR
INR: 1 (ref 0.8–1.2)
Prothrombin Time: 12.7 seconds (ref 11.4–15.2)

## 2019-02-19 LAB — APTT: aPTT: 34 seconds (ref 24–36)

## 2019-02-19 NOTE — Progress Notes (Addendum)
PCP - [name], MD Cardiologist - pt denies  Chest x-ray - 02/19/2019 at PAT appt EKG - 12/20/2018 requested from [facility]- in [place] REP Notified- n/a  Stress Test - pt denies ECHO -  Pt denies  Cardiac Cath - pt denies  Sleep Study - pt denies CPAP - n/a  Fasting Blood Sugar - n/a Checks Blood Sugar _____ times a day-n/a  Blood Thinner Instructions: n/a Aspirin Instructions: n/a  ERAS Protocol- n/a Ensure pre-surgery drink or water given- n/a  COVID testing- 02/25/2019  Anesthesia review: Yes-per order and  requested EKG  Patient denies shortness of breath, fever, cough and chest pain at PAT appointment  Patient verbalized understanding of instructions that were given to them at the PAT appointment. Patient was also instructed that they will need to review over the PAT instructions again at home before surgery.

## 2019-02-19 NOTE — H&P (Signed)
Subjective:   Patient has had long-standing back pain which is not significantly changed. She states that she can manage her chronic low back pain but the new neuropathic leg pain is quite severe. She states that she feels as though she wants the leg amputated because of the radicular pain. Imaging studies clearly identify a new foraminal disc herniation on the right side at L4-5. We have briefly discussed selective nerve root block as a possible conservative treatment modalities but she does not want to entertain injection therapy. She prefers to move forward to surgery. She is scheduled for Right L4-5 Foraminal Discectomy 02/27/19 at [facility].  Patient Active Problem List   Diagnosis Date Noted  . Lipoma of flank 09/08/2010   Past Medical History:  Diagnosis Date  . Arthritis    lower back  . Degenerative disc disease   . Diverticulitis   . Hypertension   . Lipoma    rt side  . Osteopenia   . Thyroid disease     Past Surgical History:  Procedure Laterality Date  . Arm fracture    . arthroscopy right knee  10/2018  . BREAST BIOPSY    . CERVICAL DISCECTOMY  2003  . CERVIX SURGERY  1987  . GYNECOLOGIC CRYOSURGERY    . HYSTEROSCOPY WITH D & C  1995  . TUBAL LIGATION    . VAGINAL HYSTERECTOMY  1995   menorrhagia    Current Outpatient Medications  Medication Sig Dispense Refill Last Dose  . cholecalciferol (VITAMIN D3) 25 MCG (1000 UT) tablet Take 1,000 Units by mouth daily.     Marland Kitchen gabapentin (NEURONTIN) 300 MG capsule Take 300 mg by mouth daily as needed for pain.     Marland Kitchen ibuprofen (ADVIL,MOTRIN) 800 MG tablet Take 800 mg by mouth every 8 (eight) hours as needed for moderate pain.      Marland Kitchen levothyroxine (SYNTHROID, LEVOTHROID) 100 MCG tablet Take 100 mcg by mouth daily before breakfast.      . lisdexamfetamine (VYVANSE) 60 MG capsule Take 60 mg by mouth every morning.     Marland Kitchen PENNSAID 2 % SOLN Apply 1 application topically daily as needed for pain.     Marland Kitchen triamterene-hydrochlorothiazide  (MAXZIDE-25) 37.5-25 MG tablet Take 1 tablet by mouth daily.       No current facility-administered medications for this visit.   Allergies  Allergen Reactions  . Doxycycline Anaphylaxis  . Ciprofloxacin Nausea And Vomiting  . Codeine Nausea Only  . Metronidazole Nausea And Vomiting  . Clarithromycin Nausea Only  . Erythrocin Nausea Only    Social History   Tobacco Use  . Smoking status: Former Research scientist (life sciences)  . Smokeless tobacco: Never Used  Substance Use Topics  . Alcohol use: Yes    Comment: not often    Family History  Problem Relation Age of Onset  . Hypertension Mother   . Rheum arthritis Mother   . Lupus Mother   . Hepatitis Mother        Hep C  . Parkinsonism Father   . Breast cancer Maternal Grandmother 82  . Cancer Maternal Grandmother        bone    Review of Systems As stated in HPI  Objective:   Vitals: Ht: 5 ft 3.5 in 02/19/2019 01:26 pm Wt: 153 lbs 02/19/2019 01:26 pm BMI: 26.7 02/19/2019 01:26 pm BP: 164/93 02/19/2019 01:26 pm Pulse: 88 bpm 02/19/2019 01:27 pm T: 98.7 F 02/19/2019 01:27 pm Pain Scale: 7 02/19/2019 01:27 pm   Clinical exam: Sherry Pierce  is a pleasant individual, who appears younger than their stated age. She Is alert and orientated 3.  Heart: Regular rate and rhythm, no rubs, murmurs, or gallops.  Lungs: Clear to auscultation bilaterally.  Abdomen is soft and non-tender, negative loss of bowel and bladder control, no rebound tenderness. Bowel sounds 4  Negative: skin lesions abrasions contusions  Lungs: Clear to auscultation bilaterally  Cardiac: Regular rate and rhythm no rubs gallops murmurs  Peripheral pulses: 2+ dorsalis pedis/posterior tibialis pulses. Compartment soft and nontender.  Gait pattern: Slightly abnormal gait pattern due to radicular right leg pain  Assistive devices: No assistive devices  Neuro: Positive femoral stretch test right side. Negative straight leg raise test. 5/5 motor strength bilaterally in the  lower extremity. Positive numbness and dysesthesias into the right L4 and to a lesser degree L5 dermatome. Negative Babinski test, no clonus, 1+ deep tendon reflexes at the knee and Achilles.  Musculoskeletal: Status post right knee arthroscopy with residual right knee pain. Some mild to moderate chronic low back pain which is not significantly changed. Positive significant neuropathic right leg pain.  X-rays of the lumbar spine demonstrate degenerative scoliosis with multilevel degenerative lumbar disc disease.  CT myelogram completed on 01/31/19: S of multilevel lumbar degenerative disease. New broad-based right foraminal and far lateral disc protrusion at L4-5 contacting the exiting right L4 nerve root. New mild to moderate foraminal stenosis on the right sided L3-4 and on the left side at L5-S1.  Lumbar MRI dated 02/02/19: Multilevel degenerative changes of the lumbar spine most prominent at L4-5 and L5-S1. Moderate right foraminal disc herniation which displaces the right L4 nerve root and dorsal root ganglion. Disc bulge at L5-S1 worse to the left but does not displace the S1 nerve root. L3-4 posterior hard disc osteophyte that displaces the left L4 nerve root. No significant foraminal or's central stenosis L1-L2 3. Positive multilevel Modic endplate changes L1 to and L3-4. No acute fracture or abnormal marrow signal changes.  Assessment:   Sherry Pierce is a very pleasant 63 year old and who recently had a knee arthroscopy and has persistent neuropathic leg pain. X-rays demonstrate multilevel degenerative disc disease with degenerative scoliosis. Patient has had long-standing back pain which is not significantly changed. She states that she can manage her chronic low back pain but the new neuropathic leg pain is quite severe. She states that she feels as though she wants the leg amputated because of the radicular pain. Imaging studies clearly identify a new foraminal disc herniation on the right side at  L4-5. We have briefly discussed selective nerve root block as a possible conservative treatment modalities but she does not want to entertain injection therapy. She prefers to move forward to surgery. Given the positive nerve root tension signs, imaging study findings, and the radicular nature of the pain I do think it is reasonable to move forward with a foraminal discectomy.  Plan:   L4-5 right foraminal discectomy.  I have gone over the surgical procedure in great detail including the risks and benefits.Risks and benefits of decompression/discectomy: Infection, bleeding, death, stroke, paralysis, ongoing or worse pain, need for additional surgery, leak of spinal fluid, adjacent segment degeneration requiring additional surgery, post-operative hematoma formation that can result in neurological compromise and the need for urgent/emergent re-operation. Loss in bowel and bladder control. Injury to major vessels that could result in the need for urgent abdominal surgery to stop bleeding. Risk of deep venous thrombosis (DVT) and the need for additional treatment. Recurrent disc herniation resulting in  the need for revision surgery, which could include fusion surgery (utilizing instrumentation such as pedicle screws and intervertebral cages). In addition there is a risk to injury to the dorsal root ganglion that can create sympathetic mediated radicular leg pain.  Additional risk: If instrumentation is used there is a risk of migration, or breakage of that hardware that could require additional surgery.  Goals of surgery: Reduction in pain, and improvement in quality of life.  We have also discussed the post-operative recovery period to include: bathing/showering restrictions, wound healing, activity (and driving) restrictions, medications/pain mangement.  We have also discussed post-operative redflags to include: signs and symptoms of postoperative infection, DVT/PE.  I did review the patient's  medication list with her. She is not on any blood thinners. Not on any aspirin. I did advise her to avoid any anti-inflammatory medications leading up to surgery and she expressed understanding of this.  Patient was supplied with an LSO brace at today's visit.  We have to obtain preoperative clearance from the patient's primary care provider.  Patient has had her preoperative testing at [facility].  All the patient's questions were invited and answered  I did supply the patient with a work note stating that she is scheduled for surgery and will be out of work for at least 6 weeks postoperatively and that her 6 week postop evaluation we will discuss return to work.  Follow-up: 2 weeks postop

## 2019-02-19 NOTE — Progress Notes (Addendum)
Patient's surgical PCR swab tested positive for Staph.  Patient reported that she has mupirocin at home, and would not need it called in to her pharmacy at her PAT appointment.  Called and left patient a confidential voicemail informing her of results.  Advised her to return call with any questions, or if she needed me to call prescription in for her.    Patient returned call. She could not find mupirocin. Called prescription in to [facility] per patient request 02/19/2019/ 1416

## 2019-02-20 NOTE — Anesthesia Preprocedure Evaluation (Addendum)
Anesthesia Evaluation  Patient identified by MRN, date of birth, ID band Patient awake    Reviewed: Allergy & Precautions, NPO status , Patient's Chart, lab work & pertinent test results  History of Anesthesia Complications Negative for: history of anesthetic complications  Airway Mallampati: II  TM Distance: >3 FB Neck ROM: Limited    Dental no notable dental hx. (+) Dental Advisory Given   Pulmonary former smoker,    Pulmonary exam normal        Cardiovascular hypertension, Pt. on medications Normal cardiovascular exam     Neuro/Psych Hx of cervical fusion negative psych ROS   GI/Hepatic negative GI ROS, Neg liver ROS,   Endo/Other  Hypothyroidism   Renal/GU negative Renal ROS  negative genitourinary   Musculoskeletal  (+) Arthritis ,   Abdominal   Peds  Hematology negative hematology ROS (+)   Anesthesia Other Findings Day of surgery medications reviewed with patient.  Reproductive/Obstetrics negative OB ROS                          Anesthesia Physical Anesthesia Plan  ASA: II  Anesthesia Plan: General   Post-op Pain Management:    Induction: Intravenous  PONV Risk Score and Plan: 4 or greater and Treatment may vary due to age or medical condition, Ondansetron, Dexamethasone and Midazolam  Airway Management Planned: Oral ETT and Video Laryngoscope Planned  Additional Equipment: None  Intra-op Plan:   Post-operative Plan: Extubation in OR  Informed Consent: I have reviewed the patients History and Physical, chart, labs and discussed the procedure including the risks, benefits and alternatives for the proposed anesthesia with the patient or authorized representative who has indicated his/her understanding and acceptance.     Dental advisory given  Plan Discussed with: CRNA  Anesthesia Plan Comments: (Seen and cleared by PCP Dr. Shelia Media on 02/06/19. Copy of note/clearance  on pt chart.  Per records in care everywhere, pt was seen at [facility] ED 12/20/18 for acute diverticulitis. Initial EKG was interpreted as STEMI, however further review determined it not to be a STEMI. Per ED notes, "Patient's only complaint is abdominal pain she has no chest pain or shortness of breath initial EKG was reading STEMI contacted the STEMI cardiologist who reviewed the EKGs and advised it was not a STEMI. I also checked her cardiac enzymes and she has had constant pain since Tuesday night with a negative troponin. CT shows acute sigmoid diverticulitis. Will place on Cipro and Flagyl along with pain medicine and nausea medicine. She is given return precautions."   Copy of tracing on pt chart. Reviewed with Dr. Christella Hartigan. Because tracing was over read by cardiology and determined to not be STEMI, no need to repeat on DOS.  Preop labs reviewed, WNL.    )     Anesthesia Quick Evaluation

## 2019-02-21 NOTE — Progress Notes (Signed)
Anesthesia Chart Review: Seen and cleared by PCP Dr. Shelia Media on 02/06/19. Copy of note/clearance on pt chart.  Per records in care everywhere, pt was seen at [facility] ED 12/20/18 for acute diverticulitis. Initial EKG was interpreted as STEMI, however further review determined it not to be a STEMI. Per ED notes, "Patient's only complaint is abdominal pain she has no chest pain or shortness of breath initial EKG was reading STEMI contacted the STEMI cardiologist who reviewed the EKGs and advised it was not a STEMI. I also checked her cardiac enzymes and she has had constant pain since Tuesday night with a negative troponin. CT shows acute sigmoid diverticulitis. Will place on Cipro and Flagyl along with pain medicine and nausea medicine. She is given return precautions."   Copy of tracing on pt chart. Reviewed with Dr. Christella Hartigan. Because tracing was over read by cardiology and determined to not be STEMI, no need to repeat on DOS.  Preop labs reviewed, WNL.   Wynonia Musty [facility] Short Stay Center/Anesthesiology Phone [phone] 02/21/2019 9:51 AM

## 2019-02-25 ENCOUNTER — Other Ambulatory Visit (HOSPITAL_COMMUNITY)
Admission: RE | Admit: 2019-02-25 | Discharge: 2019-02-25 | Disposition: A | Discharge status: Home / Self Care | Payer: BC Managed Care – PPO | Source: Ambulatory Visit | Attending: Orthopedic Surgery | Admitting: Orthopedic Surgery

## 2019-02-25 DIAGNOSIS — Z20828 Contact with and (suspected) exposure to other viral communicable diseases: Secondary | ICD-10-CM | POA: Diagnosis not present

## 2019-02-25 DIAGNOSIS — Z01812 Encounter for preprocedural laboratory examination: Secondary | ICD-10-CM | POA: Diagnosis not present

## 2019-02-25 LAB — SARS CORONAVIRUS 2 (TAT 6-24 HRS): SARS Coronavirus 2: NEGATIVE

## 2019-02-26 NOTE — Progress Notes (Signed)
Patient is aware of time change and will arrive at 0530.

## 2019-02-27 ENCOUNTER — Encounter (HOSPITAL_COMMUNITY): Payer: Self-pay | Admitting: Orthopedic Surgery

## 2019-02-27 ENCOUNTER — Ambulatory Visit (HOSPITAL_COMMUNITY): Payer: BC Managed Care – PPO

## 2019-02-27 ENCOUNTER — Other Ambulatory Visit: Payer: Self-pay

## 2019-02-27 ENCOUNTER — Ambulatory Visit (HOSPITAL_COMMUNITY): Payer: BC Managed Care – PPO | Admitting: Anesthesiology

## 2019-02-27 ENCOUNTER — Ambulatory Visit (HOSPITAL_COMMUNITY)
Admission: RE | Disposition: A | Discharge status: Home / Self Care | Payer: Self-pay | Source: Home / Self Care | Attending: Orthopedic Surgery

## 2019-02-27 ENCOUNTER — Ambulatory Visit (HOSPITAL_COMMUNITY)
Admission: RE | Admit: 2019-02-27 | Discharge: 2019-02-27 | Disposition: A | Discharge status: Home / Self Care | Payer: BC Managed Care – PPO | Attending: Orthopedic Surgery | Admitting: Orthopedic Surgery

## 2019-02-27 ENCOUNTER — Ambulatory Visit (HOSPITAL_COMMUNITY): Payer: BC Managed Care – PPO | Admitting: Physician Assistant

## 2019-02-27 DIAGNOSIS — Z8261 Family history of arthritis: Secondary | ICD-10-CM | POA: Insufficient documentation

## 2019-02-27 DIAGNOSIS — Z87891 Personal history of nicotine dependence: Secondary | ICD-10-CM | POA: Insufficient documentation

## 2019-02-27 DIAGNOSIS — Z885 Allergy status to narcotic agent status: Secondary | ICD-10-CM | POA: Insufficient documentation

## 2019-02-27 DIAGNOSIS — Z8249 Family history of ischemic heart disease and other diseases of the circulatory system: Secondary | ICD-10-CM | POA: Insufficient documentation

## 2019-02-27 DIAGNOSIS — I10 Essential (primary) hypertension: Secondary | ICD-10-CM | POA: Insufficient documentation

## 2019-02-27 DIAGNOSIS — Z90710 Acquired absence of both cervix and uterus: Secondary | ICD-10-CM | POA: Insufficient documentation

## 2019-02-27 DIAGNOSIS — Z881 Allergy status to other antibiotic agents status: Secondary | ICD-10-CM | POA: Diagnosis not present

## 2019-02-27 DIAGNOSIS — Z981 Arthrodesis status: Secondary | ICD-10-CM | POA: Insufficient documentation

## 2019-02-27 DIAGNOSIS — Z888 Allergy status to other drugs, medicaments and biological substances status: Secondary | ICD-10-CM | POA: Diagnosis not present

## 2019-02-27 DIAGNOSIS — M8580 Other specified disorders of bone density and structure, unspecified site: Secondary | ICD-10-CM | POA: Diagnosis not present

## 2019-02-27 DIAGNOSIS — Z419 Encounter for procedure for purposes other than remedying health state, unspecified: Secondary | ICD-10-CM

## 2019-02-27 DIAGNOSIS — E039 Hypothyroidism, unspecified: Secondary | ICD-10-CM | POA: Insufficient documentation

## 2019-02-27 DIAGNOSIS — M5126 Other intervertebral disc displacement, lumbar region: Secondary | ICD-10-CM | POA: Diagnosis not present

## 2019-02-27 DIAGNOSIS — Z820 Family history of epilepsy and other diseases of the nervous system: Secondary | ICD-10-CM | POA: Diagnosis not present

## 2019-02-27 DIAGNOSIS — Z803 Family history of malignant neoplasm of breast: Secondary | ICD-10-CM | POA: Insufficient documentation

## 2019-02-27 DIAGNOSIS — M5116 Intervertebral disc disorders with radiculopathy, lumbar region: Secondary | ICD-10-CM | POA: Insufficient documentation

## 2019-02-27 DIAGNOSIS — Z809 Family history of malignant neoplasm, unspecified: Secondary | ICD-10-CM | POA: Insufficient documentation

## 2019-02-27 DIAGNOSIS — Z79899 Other long term (current) drug therapy: Secondary | ICD-10-CM | POA: Insufficient documentation

## 2019-02-27 DIAGNOSIS — M1990 Unspecified osteoarthritis, unspecified site: Secondary | ICD-10-CM | POA: Insufficient documentation

## 2019-02-27 HISTORY — PX: LUMBAR LAMINECTOMY/DECOMPRESSION MICRODISCECTOMY: SHX5026

## 2019-02-27 SURGERY — LUMBAR LAMINECTOMY/DECOMPRESSION MICRODISCECTOMY 1 LEVEL
Anesthesia: General | Site: Spine Lumbar | Laterality: Right

## 2019-02-27 MED ORDER — DEXAMETHASONE SODIUM PHOSPHATE 10 MG/ML IJ SOLN
INTRAMUSCULAR | Status: AC
  Filled 2019-02-27: qty 1

## 2019-02-27 MED ORDER — ACETAMINOPHEN 650 MG RE SUPP: 650.0000 mg | RECTAL | Status: DC | PRN

## 2019-02-27 MED ORDER — METHOCARBAMOL 500 MG PO TABS
ORAL_TABLET | ORAL | Status: AC
  Filled 2019-02-27: qty 1

## 2019-02-27 MED ORDER — HEMOSTATIC AGENTS (NO CHARGE) OPTIME
TOPICAL | Status: DC | PRN
  Administered 2019-02-27: 1

## 2019-02-27 MED ORDER — METHOCARBAMOL 1000 MG/10ML IJ SOLN
500.0000 mg | Freq: Four times a day (QID) | INTRAVENOUS | Status: DC | PRN
  Filled 2019-02-27: qty 5

## 2019-02-27 MED ORDER — SUGAMMADEX SODIUM 200 MG/2ML IV SOLN
INTRAVENOUS | Status: DC | PRN
  Administered 2019-02-27: 150 mg via INTRAVENOUS

## 2019-02-27 MED ORDER — 0.9 % SODIUM CHLORIDE (POUR BTL) OPTIME
TOPICAL | Status: DC | PRN
  Administered 2019-02-27: 1000 mL

## 2019-02-27 MED ORDER — FENTANYL CITRATE (PF) 100 MCG/2ML IJ SOLN
INTRAMUSCULAR | Status: DC | PRN
  Administered 2019-02-27: 50 ug via INTRAVENOUS
  Administered 2019-02-27: 125 ug via INTRAVENOUS
  Administered 2019-02-27: 75 ug via INTRAVENOUS

## 2019-02-27 MED ORDER — LIDOCAINE 2% (20 MG/ML) 5 ML SYRINGE
INTRAMUSCULAR | Status: DC | PRN
  Administered 2019-02-27: 50 mg via INTRAVENOUS

## 2019-02-27 MED ORDER — FENTANYL CITRATE (PF) 100 MCG/2ML IJ SOLN
INTRAMUSCULAR | Status: AC
  Filled 2019-02-27: qty 2

## 2019-02-27 MED ORDER — KETOROLAC TROMETHAMINE 30 MG/ML IJ SOLN
30.0000 mg | Freq: Once | INTRAMUSCULAR | Status: AC | PRN
  Administered 2019-02-27: 10:00:00 30 mg via INTRAVENOUS

## 2019-02-27 MED ORDER — OXYCODONE HCL 5 MG PO TABS
5.0000 mg | ORAL_TABLET | Freq: Once | ORAL | Status: AC | PRN
  Administered 2019-02-27: 5 mg via ORAL

## 2019-02-27 MED ORDER — ONDANSETRON HCL 4 MG/2ML IJ SOLN: 4.0000 mg | Freq: Four times a day (QID) | INTRAMUSCULAR | Status: DC | PRN

## 2019-02-27 MED ORDER — MIDAZOLAM HCL 5 MG/5ML IJ SOLN
INTRAMUSCULAR | Status: DC | PRN
  Administered 2019-02-27: 2 mg via INTRAVENOUS

## 2019-02-27 MED ORDER — CEFAZOLIN SODIUM-DEXTROSE 2-4 GM/100ML-% IV SOLN
2.0000 g | INTRAVENOUS | Status: AC
  Administered 2019-02-27: 2 g via INTRAVENOUS
  Filled 2019-02-27: qty 100

## 2019-02-27 MED ORDER — THROMBIN 20000 UNITS EX SOLR
CUTANEOUS | Status: DC | PRN
  Administered 2019-02-27: 20 mL

## 2019-02-27 MED ORDER — BUPIVACAINE-EPINEPHRINE (PF) 0.25% -1:200000 IJ SOLN
INTRAMUSCULAR | Status: AC
  Filled 2019-02-27: qty 10

## 2019-02-27 MED ORDER — OXYCODONE HCL 5 MG PO TABS: 10.0000 mg | ORAL_TABLET | ORAL | Status: DC | PRN

## 2019-02-27 MED ORDER — MENTHOL 3 MG MT LOZG: 1.0000 | LOZENGE | OROMUCOSAL | Status: DC | PRN

## 2019-02-27 MED ORDER — SUCCINYLCHOLINE CHLORIDE 200 MG/10ML IV SOSY
PREFILLED_SYRINGE | INTRAVENOUS | Status: AC
  Filled 2019-02-27: qty 10

## 2019-02-27 MED ORDER — DEXAMETHASONE SODIUM PHOSPHATE 4 MG/ML IJ SOLN
INTRAMUSCULAR | Status: DC | PRN
  Administered 2019-02-27: 4 mg via INTRAVENOUS

## 2019-02-27 MED ORDER — PROMETHAZINE HCL 25 MG/ML IJ SOLN: 6.2500 mg | INTRAMUSCULAR | Status: DC | PRN

## 2019-02-27 MED ORDER — ONDANSETRON HCL 4 MG/2ML IJ SOLN
INTRAMUSCULAR | Status: DC | PRN
  Administered 2019-02-27: 4 mg via INTRAVENOUS

## 2019-02-27 MED ORDER — ONDANSETRON HCL 4 MG/2ML IJ SOLN
INTRAMUSCULAR | Status: AC
  Filled 2019-02-27: qty 2

## 2019-02-27 MED ORDER — PHENOL 1.4 % MT LIQD: 1.0000 | OROMUCOSAL | Status: DC | PRN

## 2019-02-27 MED ORDER — KETOROLAC TROMETHAMINE 30 MG/ML IJ SOLN
INTRAMUSCULAR | Status: AC
  Filled 2019-02-27: qty 1

## 2019-02-27 MED ORDER — LIDOCAINE 2% (20 MG/ML) 5 ML SYRINGE
INTRAMUSCULAR | Status: AC
  Filled 2019-02-27: qty 5

## 2019-02-27 MED ORDER — MIDAZOLAM HCL 2 MG/2ML IJ SOLN
INTRAMUSCULAR | Status: AC
  Filled 2019-02-27: qty 2

## 2019-02-27 MED ORDER — LACTATED RINGERS IV SOLN: INTRAVENOUS | Status: DC

## 2019-02-27 MED ORDER — ACETAMINOPHEN 500 MG PO TABS
1000.0000 mg | ORAL_TABLET | Freq: Once | ORAL | Status: AC
  Administered 2019-02-27: 1000 mg via ORAL
  Filled 2019-02-27: qty 2

## 2019-02-27 MED ORDER — SODIUM CHLORIDE 0.9% FLUSH: 3.0000 mL | Freq: Two times a day (BID) | INTRAVENOUS | Status: DC

## 2019-02-27 MED ORDER — ONDANSETRON HCL 4 MG PO TABS: 4.0000 mg | ORAL_TABLET | Freq: Four times a day (QID) | ORAL | Status: DC | PRN

## 2019-02-27 MED ORDER — ACETAMINOPHEN 325 MG PO TABS: 650.0000 mg | ORAL_TABLET | ORAL | Status: DC | PRN

## 2019-02-27 MED ORDER — ROCURONIUM BROMIDE 10 MG/ML (PF) SYRINGE
PREFILLED_SYRINGE | INTRAVENOUS | Status: DC | PRN
  Administered 2019-02-27: 50 mg via INTRAVENOUS
  Administered 2019-02-27: 20 mg via INTRAVENOUS

## 2019-02-27 MED ORDER — OXYCODONE HCL 5 MG PO TABS
5.0000 mg | ORAL_TABLET | ORAL | Status: DC | PRN
  Administered 2019-02-27: 5 mg via ORAL

## 2019-02-27 MED ORDER — SODIUM CHLORIDE 0.9 % IV SOLN: 250.0000 mL | INTRAVENOUS | Status: DC

## 2019-02-27 MED ORDER — LACTATED RINGERS IV SOLN: INTRAVENOUS | Status: DC | PRN

## 2019-02-27 MED ORDER — SUCCINYLCHOLINE CHLORIDE 200 MG/10ML IV SOSY
PREFILLED_SYRINGE | INTRAVENOUS | Status: DC | PRN
  Administered 2019-02-27: 120 mg via INTRAVENOUS

## 2019-02-27 MED ORDER — FENTANYL CITRATE (PF) 250 MCG/5ML IJ SOLN
INTRAMUSCULAR | Status: AC
  Filled 2019-02-27: qty 5

## 2019-02-27 MED ORDER — BUPIVACAINE-EPINEPHRINE (PF) 0.25% -1:200000 IJ SOLN
INTRAMUSCULAR | Status: AC
  Filled 2019-02-27: qty 30

## 2019-02-27 MED ORDER — ONDANSETRON HCL 4 MG PO TABS: 4.0000 mg | ORAL_TABLET | Freq: Three times a day (TID) | ORAL | 0 refills | Status: DC | PRN

## 2019-02-27 MED ORDER — PROPOFOL 10 MG/ML IV BOLUS
INTRAVENOUS | Status: DC | PRN
  Administered 2019-02-27: 120 mg via INTRAVENOUS

## 2019-02-27 MED ORDER — OXYCODONE-ACETAMINOPHEN 10-325 MG PO TABS: 1.0000 | ORAL_TABLET | Freq: Four times a day (QID) | ORAL | 0 refills | Status: AC | PRN

## 2019-02-27 MED ORDER — METHYLPREDNISOLONE ACETATE 40 MG/ML IJ SUSP
INTRAMUSCULAR | Status: DC | PRN
  Administered 2019-02-27: 40 mg

## 2019-02-27 MED ORDER — FENTANYL CITRATE (PF) 100 MCG/2ML IJ SOLN
25.0000 ug | INTRAMUSCULAR | Status: DC | PRN
  Administered 2019-02-27 (×2): 50 ug via INTRAVENOUS

## 2019-02-27 MED ORDER — METHYLPREDNISOLONE ACETATE 40 MG/ML IJ SUSP
INTRAMUSCULAR | Status: AC
  Filled 2019-02-27: qty 1

## 2019-02-27 MED ORDER — BUPIVACAINE-EPINEPHRINE 0.25% -1:200000 IJ SOLN
INTRAMUSCULAR | Status: DC | PRN
  Administered 2019-02-27: 20 mL

## 2019-02-27 MED ORDER — ROCURONIUM BROMIDE 10 MG/ML (PF) SYRINGE
PREFILLED_SYRINGE | INTRAVENOUS | Status: AC
  Filled 2019-02-27: qty 10

## 2019-02-27 MED ORDER — CEFAZOLIN SODIUM-DEXTROSE 1-4 GM/50ML-% IV SOLN: 1.0000 g | Freq: Three times a day (TID) | INTRAVENOUS | Status: DC

## 2019-02-27 MED ORDER — OXYCODONE HCL 5 MG/5ML PO SOLN: 5.0000 mg | Freq: Once | ORAL | Status: AC | PRN

## 2019-02-27 MED ORDER — OXYCODONE HCL 5 MG PO TABS
ORAL_TABLET | ORAL | Status: AC
  Filled 2019-02-27: qty 2

## 2019-02-27 MED ORDER — SODIUM CHLORIDE 0.9% FLUSH: 3.0000 mL | INTRAVENOUS | Status: DC | PRN

## 2019-02-27 MED ORDER — METHOCARBAMOL 500 MG PO TABS
500.0000 mg | ORAL_TABLET | Freq: Four times a day (QID) | ORAL | Status: DC | PRN
  Administered 2019-02-27: 11:00:00 500 mg via ORAL

## 2019-02-27 MED ORDER — METHOCARBAMOL 500 MG PO TABS: 500.0000 mg | ORAL_TABLET | Freq: Three times a day (TID) | ORAL | 0 refills | Status: AC | PRN

## 2019-02-27 MED ORDER — THROMBIN (RECOMBINANT) 20000 UNITS EX SOLR
CUTANEOUS | Status: AC
  Filled 2019-02-27: qty 20000

## 2019-02-27 MED ORDER — PROPOFOL 10 MG/ML IV BOLUS
INTRAVENOUS | Status: AC
  Filled 2019-02-27: qty 20

## 2019-02-27 SURGICAL SUPPLY — 56 items
AGENT HMST KT MTR STRL THRMB (HEMOSTASIS) ×1
BNDG GAUZE ELAST 4 BULKY (GAUZE/BANDAGES/DRESSINGS) ×2 IMPLANT
BUR EGG ELITE 4.0 (BURR) IMPLANT
BUR MATCHSTICK NEURO 3.0 LAGG (BURR) IMPLANT
CANISTER SUCT 3000ML PPV (MISCELLANEOUS) ×2 IMPLANT
CLSR STERI-STRIP ANTIMIC 1/2X4 (GAUZE/BANDAGES/DRESSINGS) ×2 IMPLANT
COVER SURGICAL LIGHT HANDLE (MISCELLANEOUS) ×2 IMPLANT
COVER WAND RF STERILE (DRAPES) ×2 IMPLANT
DRAPE POUCH INSTRU U-SHP 10X18 (DRAPES) ×2 IMPLANT
DRAPE SURG 17X23 STRL (DRAPES) ×2 IMPLANT
DRAPE U-SHAPE 47X51 STRL (DRAPES) ×2 IMPLANT
DRSG OPSITE POSTOP 3X4 (GAUZE/BANDAGES/DRESSINGS) ×2 IMPLANT
DRSG OPSITE POSTOP 4X6 (GAUZE/BANDAGES/DRESSINGS) ×2 IMPLANT
DURAPREP 26ML APPLICATOR (WOUND CARE) ×2 IMPLANT
ELECT BLADE 4.0 EZ CLEAN MEGAD (MISCELLANEOUS)
ELECT CAUTERY BLADE 6.4 (BLADE) ×2 IMPLANT
ELECT PENCIL ROCKER SW 15FT (MISCELLANEOUS) ×2 IMPLANT
ELECT REM PT RETURN 9FT ADLT (ELECTROSURGICAL) ×2
ELECTRODE BLDE 4.0 EZ CLN MEGD (MISCELLANEOUS) IMPLANT
ELECTRODE REM PT RTRN 9FT ADLT (ELECTROSURGICAL) ×1 IMPLANT
GLOVE BIO SURGEON STRL SZ 6.5 (GLOVE) ×2 IMPLANT
GLOVE BIOGEL PI IND STRL 6.5 (GLOVE) ×1 IMPLANT
GLOVE BIOGEL PI IND STRL 8.5 (GLOVE) ×1 IMPLANT
GLOVE BIOGEL PI INDICATOR 6.5 (GLOVE) ×1
GLOVE BIOGEL PI INDICATOR 8.5 (GLOVE) ×1
GLOVE SS BIOGEL STRL SZ 8.5 (GLOVE) ×1 IMPLANT
GLOVE SUPERSENSE BIOGEL SZ 8.5 (GLOVE) ×1
GOWN STRL REUS W/ TWL LRG LVL3 (GOWN DISPOSABLE) ×2 IMPLANT
GOWN STRL REUS W/TWL 2XL LVL3 (GOWN DISPOSABLE) ×2 IMPLANT
GOWN STRL REUS W/TWL LRG LVL3 (GOWN DISPOSABLE) ×4
KIT BASIN OR (CUSTOM PROCEDURE TRAY) ×2 IMPLANT
KIT TURNOVER KIT B (KITS) ×2 IMPLANT
NEEDLE 22X1 1/2 (OR ONLY) (NEEDLE) ×2 IMPLANT
NEEDLE SPNL 18GX3.5 QUINCKE PK (NEEDLE) ×4 IMPLANT
NS IRRIG 1000ML POUR BTL (IV SOLUTION) ×2 IMPLANT
PACK LAMINECTOMY ORTHO (CUSTOM PROCEDURE TRAY) ×2 IMPLANT
PACK UNIVERSAL I (CUSTOM PROCEDURE TRAY) ×2 IMPLANT
PAD ARMBOARD 7.5X6 YLW CONV (MISCELLANEOUS) ×4 IMPLANT
PATTIES SURGICAL .5 X.5 (GAUZE/BANDAGES/DRESSINGS) ×2 IMPLANT
PATTIES SURGICAL .5 X1 (DISPOSABLE) ×2 IMPLANT
SPONGE SURGIFOAM ABS GEL 100 (HEMOSTASIS) ×2 IMPLANT
SURGIFLO W/THROMBIN 8M KIT (HEMOSTASIS) ×2 IMPLANT
SUT BONE WAX W31G (SUTURE) ×2 IMPLANT
SUT MON AB 3-0 SH 27 (SUTURE) ×2
SUT MON AB 3-0 SH27 (SUTURE) ×1 IMPLANT
SUT VIC AB 0 CT1 27 (SUTURE)
SUT VIC AB 0 CT1 27XBRD ANBCTR (SUTURE) IMPLANT
SUT VIC AB 1 CT1 18XCR BRD 8 (SUTURE) ×1 IMPLANT
SUT VIC AB 1 CT1 8-18 (SUTURE) ×2
SUT VIC AB 2-0 CT1 18 (SUTURE) ×2 IMPLANT
SYR BULB IRRIGATION 50ML (SYRINGE) ×2 IMPLANT
SYR CONTROL 10ML LL (SYRINGE) ×2 IMPLANT
TOWEL GREEN STERILE (TOWEL DISPOSABLE) ×2 IMPLANT
TOWEL GREEN STERILE FF (TOWEL DISPOSABLE) ×2 IMPLANT
WATER STERILE IRR 1000ML POUR (IV SOLUTION) ×2 IMPLANT
YANKAUER SUCT BULB TIP NO VENT (SUCTIONS) IMPLANT

## 2019-02-27 NOTE — Brief Op Note (Signed)
02/27/2019  9:26 AM  PATIENT:  Sherry Pierce  63 y.o. female  PRE-OPERATIVE DIAGNOSIS:  Right foramial herniated disc Lumbar four-five  POST-OPERATIVE DIAGNOSIS:  Right foramial herniated disc Lumbar four-five  PROCEDURE:  Procedure(s) with comments: Right foraminal disectomy L4-5 (Right) - 2.5 hrs  SURGEON:  Surgeon(s) and Role:    Melina Schools, MD - Primary  PHYSICIAN ASSISTANT:   ASSISTANTS: Amanda Ward, PA   ANESTHESIA:   general  EBL:  20 mL   BLOOD ADMINISTERED:none  DRAINS: none   LOCAL MEDICATIONS USED:  MARCAINE    and OTHER depomedrol  SPECIMEN:  No Specimen  DISPOSITION OF SPECIMEN:  N/A  COUNTS:  YES  TOURNIQUET:  * No tourniquets in log *  DICTATION: .Dragon Dictation  PLAN OF CARE: Discharge to home after PACU  PATIENT DISPOSITION:  PACU - hemodynamically stable.

## 2019-02-27 NOTE — Transfer of Care (Signed)
Immediate Anesthesia Transfer of Care Note  Patient: Sherry Pierce  Procedure(s) Performed: Right foraminal disectomy L4-5 (Right Spine Lumbar)  Patient Location: PACU  Anesthesia Type:General  Level of Consciousness: oriented, drowsy and patient cooperative  Airway & Oxygen Therapy: Patient Spontanous Breathing and Patient connected to face mask oxygen  Post-op Assessment: Report given to RN and Post -op Vital signs reviewed and stable  Post vital signs: Reviewed  Last Vitals:  Vitals Value Taken Time  BP    Temp    Pulse 90 02/27/19 0943  Resp 10 02/27/19 0943  SpO2 97 % 02/27/19 0943  Vitals shown include unvalidated device data.  Last Pain:  Vitals:   02/27/19 0627  TempSrc:   PainSc: 0-No pain         Complications: No apparent anesthesia complications

## 2019-02-27 NOTE — Anesthesia Postprocedure Evaluation (Signed)
Anesthesia Post Note  Patient: Henretta Clarey  Procedure(s) Performed: Right foraminal disectomy L4-5 (Right Spine Lumbar)     Patient location during evaluation: PACU Anesthesia Type: General Level of consciousness: awake and alert and oriented Pain management: pain level controlled Vital Signs Assessment: post-procedure vital signs reviewed and stable Respiratory status: spontaneous breathing, nonlabored ventilation and respiratory function stable Cardiovascular status: blood pressure returned to baseline Postop Assessment: no apparent nausea or vomiting Anesthetic complications: no    Last Vitals:  Vitals:   02/27/19 0947 02/27/19 1002  BP: (!) 141/87 132/75  Pulse: 90 88  Resp: 15 (!) 8  Temp:    SpO2: 97% 90%    Last Pain:  Vitals:   02/27/19 1037  TempSrc:   PainSc: [place]

## 2019-02-27 NOTE — Discharge Instructions (Signed)

## 2019-02-27 NOTE — Op Note (Signed)
Operative report  Preoperative diagnosis: Right L4-5 foraminal disc herniation with right L4 nerve root compression  Postoperative diagnosis: Same  Operative procedure: Right L4 foraminotomy and discectomy  Complications: None  Estimated blood loss: Minimal  First Assistant: Cleta Alberts, PA  Intraoperative findings: Foraminal/extraforaminal disc herniation consistent with what was seen on preoperative MRI.  Excised without complication.  The amount of disc material removed was also consistent with the size of the disc herniation seen on preoperative MRI.  Indications: Sherry Pierce is a very pleasant 63 year old woman who presents with significant radicular leg pain in the L4 dermatome.  Imaging studies confirmed a foraminal disc herniation at L4-5 with compression of the L4 nerve root in the foramen and extraforaminal region.  As a result of the failure of conservative care to alleviate her symptoms she elected to move forward with surgery.  All appropriate risks benefits and alternatives were discussed with the patient and consent was obtained.  Operative report: Patient is brought the operating room placed upon the operating room table.  After successful induction of general anesthesia and endotracheal ovation teds SCDs were applied she was turned prone onto the Wilson frame.  All bony prominences were well-padded and the back was prepped and draped in a standard fashion.  Timeout was taken to confirm patient procedure and all other important data.  Using fluoroscopy identified the L4-5 level and the lateral aspect of the pars.  I marked this area out on the skin and infiltrated the area with quarter percent Marcaine with epinephrine.  An incision was made approximately 1-1/2 fingerbreadths off of midline to the right-hand side and sharp dissection was carried out down to the deep I then bluntly dissected through the deep fascia until I could palpate the facet complex and the pars of L4.  I then  placed the retracting device and to expose the posterior aspect of the spine.  Using a curette I mobilized the remaining paraspinal muscles to completely expose the L4-5 facet and the L4 pars.  I then placed a Penfield 4 along the lateral border of the pars and took a second intraoperative x-ray to confirm I was at the L4 foramen.  Using a Penfield 4 I dissected through the ligamentum flavum/facet capsule until I was able to pass my 2 mm Kerrison rongeur under the pars and resect the lateral portion of the pars.  I did not sacrifice a significant amount of the pars.  I then used my fine nerve hook to dissect through the ligamentum flavum and then remove it with a 2 mm Kerrison rongeur.  I could now visualize the L4 nerve root in the foramen.  I protected this with a neuro patty and then dissected over the disc space until I exposed the posterior annulus.  I did take down the superior portion of the facet complex in order to better visualize the disc space.  Hemostasis was obtained using bipolar cautery.  Annulotomy was performed with a 15 blade scalpel and then using a small nerve hook I mobilized the disc fragment and removed it with my micropituitary rongeurs.  I then used my Epstein curettes to remove any osteophyte that was underneath the annulus in order to improve my discectomy/decompression.  I again the micropituitary rongeurs were used to remove the fragments.  At this point with my nerve hook I was able to pass underneath the L4 nerve root confirming adequate decompression.  I was also able to palpate the L4 pedicle, as well as the lateral recess down  to the L5 pedicle.  At this point I was pleased with the overall discectomy/decompression.  I had removed multiple fragments of disc material collectively consistent with the preoperative MRI.  Having confirmed adequate decompression of the L4 nerve root and confirming that it was freely mobile I irrigated the wound copiously normal saline.  Using bipolar  cautery and FloSeal I obtained hemostasis.  After final irrigation I did place approximately 20 mg (1/2 cc) of Depo-Medrol over the L4 nerve root in order to improve postoperative analgesia.  The wound was then closed in a layered fashion with interrupted #1 Vicryl suture, 2-0 Vicryl suture, and 3-0 Monocryl for the skin.  Steri-Strips and a dry dressing were applied and the patient was ultimately extubated transferred incident.  The end of the case all needle sponge counts were correct.  There were no adverse intraoperative events.

## 2019-02-27 NOTE — H&P (Signed)
Addendum H&P  There is been no change in the patient's clinical exam since her last office visit of 02/19/2019.  She continues to have significant right radicular leg pain in the L4 dermatome.  Plan on moving forward with a right L4-5 foraminal discectomy for L4 nerve compression.  I have again gone over the risks and benefits as well as alternatives to surgery with the patient and she is expressed a desire to move forward with surgery.  All of her questions were encouraged and addressed.

## 2019-02-27 NOTE — Anesthesia Procedure Notes (Addendum)
Procedure Name: Intubation Date/Time: 02/27/2019 7:43 AM Performed by: Jenne Campus, CRNA Pre-anesthesia Checklist: Patient identified, Emergency Drugs available, Suction available and Patient being monitored Patient Re-evaluated:Patient Re-evaluated prior to induction Oxygen Delivery Method: Circle System Utilized Preoxygenation: Pre-oxygenation with 100% oxygen Induction Type: IV induction Ventilation: Mask ventilation without difficulty Laryngoscope Size: Glidescope and 3 Grade View: Grade I Tube type: Oral Tube size: 7.0 mm Number of attempts: 1 Airway Equipment and Method: Stylet and Oral airway Placement Confirmation: ETT inserted through vocal cords under direct vision,  positive ETCO2 and breath sounds checked- equal and bilateral Secured at: 21 cm Tube secured with: Tape Dental Injury: Teeth and Oropharynx as per pre-operative assessment  Difficulty Due To: Difficult Airway- due to reduced neck mobility Comments: Patient states she had a 3 level anterior neck fusion 17 years ago and demonstrates reduced neck mobility. Plan for elective video laryngoscopy for intubation. Head/neck remained in neutral position during induction and intubation.

## 2019-02-28 MED FILL — Thrombin (Recombinant) For Soln 20000 Unit: CUTANEOUS | Qty: 1 | Status: AC

## 2019-03-02 DIAGNOSIS — Z20828 Contact with and (suspected) exposure to other viral communicable diseases: Secondary | ICD-10-CM | POA: Diagnosis not present

## 2019-03-11 ENCOUNTER — Other Ambulatory Visit (HOSPITAL_COMMUNITY): Payer: Self-pay | Admitting: Orthopedic Surgery

## 2019-03-11 ENCOUNTER — Ambulatory Visit (HOSPITAL_COMMUNITY)
Admission: RE | Admit: 2019-03-11 | Discharge: 2019-03-11 | Disposition: A | Discharge status: Home / Self Care | Payer: BC Managed Care – PPO | Source: Ambulatory Visit | Attending: Cardiology | Admitting: Cardiology

## 2019-03-11 ENCOUNTER — Other Ambulatory Visit: Payer: Self-pay

## 2019-03-11 DIAGNOSIS — M79604 Pain in right leg: Secondary | ICD-10-CM | POA: Diagnosis not present

## 2019-03-11 DIAGNOSIS — M79661 Pain in right lower leg: Secondary | ICD-10-CM | POA: Diagnosis not present

## 2019-03-11 DIAGNOSIS — M7989 Other specified soft tissue disorders: Secondary | ICD-10-CM

## 2019-03-12 DIAGNOSIS — M5416 Radiculopathy, lumbar region: Secondary | ICD-10-CM | POA: Diagnosis not present

## 2019-03-16 DIAGNOSIS — M5416 Radiculopathy, lumbar region: Secondary | ICD-10-CM | POA: Diagnosis not present

## 2019-03-28 DIAGNOSIS — Z79891 Long term (current) use of opiate analgesic: Secondary | ICD-10-CM | POA: Diagnosis not present

## 2019-04-02 ENCOUNTER — Ambulatory Visit: Payer: Self-pay | Admitting: Orthopedic Surgery

## 2019-04-04 DIAGNOSIS — M5136 Other intervertebral disc degeneration, lumbar region: Secondary | ICD-10-CM | POA: Diagnosis not present

## 2019-04-04 DIAGNOSIS — M48062 Spinal stenosis, lumbar region with neurogenic claudication: Secondary | ICD-10-CM | POA: Diagnosis not present

## 2019-04-04 DIAGNOSIS — M4125 Other idiopathic scoliosis, thoracolumbar region: Secondary | ICD-10-CM | POA: Diagnosis not present

## 2019-04-08 ENCOUNTER — Other Ambulatory Visit: Payer: Self-pay | Admitting: Orthopedic Surgery

## 2019-04-08 DIAGNOSIS — M5136 Other intervertebral disc degeneration, lumbar region: Secondary | ICD-10-CM

## 2019-04-08 DIAGNOSIS — M48062 Spinal stenosis, lumbar region with neurogenic claudication: Secondary | ICD-10-CM

## 2019-04-08 DIAGNOSIS — M4125 Other idiopathic scoliosis, thoracolumbar region: Secondary | ICD-10-CM

## 2019-04-11 ENCOUNTER — Ambulatory Visit
Admission: RE | Admit: 2019-04-11 | Discharge: 2019-04-11 | Disposition: A | Discharge status: Home / Self Care | Payer: BC Managed Care – PPO | Source: Ambulatory Visit | Attending: Orthopedic Surgery | Admitting: Orthopedic Surgery

## 2019-04-11 DIAGNOSIS — M2578 Osteophyte, vertebrae: Secondary | ICD-10-CM | POA: Diagnosis not present

## 2019-04-11 DIAGNOSIS — M5115 Intervertebral disc disorders with radiculopathy, thoracolumbar region: Secondary | ICD-10-CM | POA: Diagnosis not present

## 2019-04-11 DIAGNOSIS — M48062 Spinal stenosis, lumbar region with neurogenic claudication: Secondary | ICD-10-CM

## 2019-04-11 DIAGNOSIS — M5117 Intervertebral disc disorders with radiculopathy, lumbosacral region: Secondary | ICD-10-CM | POA: Diagnosis not present

## 2019-04-11 DIAGNOSIS — M4125 Other idiopathic scoliosis, thoracolumbar region: Secondary | ICD-10-CM

## 2019-04-11 DIAGNOSIS — R200 Anesthesia of skin: Secondary | ICD-10-CM | POA: Diagnosis not present

## 2019-04-11 DIAGNOSIS — M5136 Other intervertebral disc degeneration, lumbar region: Secondary | ICD-10-CM

## 2019-04-12 DIAGNOSIS — M8938 Hypertrophy of bone, other site: Secondary | ICD-10-CM | POA: Diagnosis not present

## 2019-04-12 DIAGNOSIS — M4125 Other idiopathic scoliosis, thoracolumbar region: Secondary | ICD-10-CM | POA: Diagnosis not present

## 2019-04-17 ENCOUNTER — Inpatient Hospital Stay (HOSPITAL_COMMUNITY): Admission: RE | Admit: 2019-04-17 | Payer: BC Managed Care – PPO | Source: Ambulatory Visit

## 2019-04-20 ENCOUNTER — Other Ambulatory Visit (HOSPITAL_COMMUNITY): Payer: BC Managed Care – PPO

## 2019-04-24 ENCOUNTER — Inpatient Hospital Stay: Admit: 2019-04-24 | Payer: BC Managed Care – PPO | Admitting: Orthopedic Surgery

## 2019-04-24 SURGERY — TRANSFORAMINAL LUMBAR INTERBODY FUSION (TLIF) WITH PEDICLE SCREW FIXATION 1 LEVEL
Anesthesia: General

## 2019-04-30 DIAGNOSIS — M8938 Hypertrophy of bone, other site: Secondary | ICD-10-CM | POA: Diagnosis not present

## 2019-04-30 DIAGNOSIS — M4124 Other idiopathic scoliosis, thoracic region: Secondary | ICD-10-CM | POA: Diagnosis not present

## 2019-04-30 DIAGNOSIS — M797 Fibromyalgia: Secondary | ICD-10-CM | POA: Diagnosis not present

## 2019-04-30 DIAGNOSIS — I10 Essential (primary) hypertension: Secondary | ICD-10-CM | POA: Diagnosis not present

## 2019-04-30 DIAGNOSIS — Z885 Allergy status to narcotic agent status: Secondary | ICD-10-CM | POA: Diagnosis not present

## 2019-04-30 DIAGNOSIS — M48062 Spinal stenosis, lumbar region with neurogenic claudication: Secondary | ICD-10-CM | POA: Diagnosis not present

## 2019-04-30 DIAGNOSIS — E785 Hyperlipidemia, unspecified: Secondary | ICD-10-CM | POA: Diagnosis not present

## 2019-04-30 DIAGNOSIS — M48061 Spinal stenosis, lumbar region without neurogenic claudication: Secondary | ICD-10-CM | POA: Diagnosis not present

## 2019-04-30 DIAGNOSIS — M4125 Other idiopathic scoliosis, thoracolumbar region: Secondary | ICD-10-CM | POA: Diagnosis not present

## 2019-04-30 DIAGNOSIS — Z882 Allergy status to sulfonamides status: Secondary | ICD-10-CM | POA: Diagnosis not present

## 2019-04-30 DIAGNOSIS — F909 Attention-deficit hyperactivity disorder, unspecified type: Secondary | ICD-10-CM | POA: Diagnosis not present

## 2019-04-30 DIAGNOSIS — Z01818 Encounter for other preprocedural examination: Secondary | ICD-10-CM | POA: Diagnosis not present

## 2019-04-30 DIAGNOSIS — Z881 Allergy status to other antibiotic agents status: Secondary | ICD-10-CM | POA: Diagnosis not present

## 2019-04-30 DIAGNOSIS — D62 Acute posthemorrhagic anemia: Secondary | ICD-10-CM | POA: Diagnosis not present

## 2019-04-30 DIAGNOSIS — E7800 Pure hypercholesterolemia, unspecified: Secondary | ICD-10-CM | POA: Diagnosis not present

## 2019-04-30 DIAGNOSIS — Z79899 Other long term (current) drug therapy: Secondary | ICD-10-CM | POA: Diagnosis not present

## 2019-05-23 DIAGNOSIS — M545 Low back pain: Secondary | ICD-10-CM | POA: Diagnosis not present

## 2019-05-23 DIAGNOSIS — M4327 Fusion of spine, lumbosacral region: Secondary | ICD-10-CM | POA: Diagnosis not present

## 2019-05-23 DIAGNOSIS — M79605 Pain in left leg: Secondary | ICD-10-CM | POA: Diagnosis not present

## 2019-06-17 DIAGNOSIS — F988 Other specified behavioral and emotional disorders with onset usually occurring in childhood and adolescence: Secondary | ICD-10-CM | POA: Diagnosis not present

## 2019-06-17 DIAGNOSIS — R030 Elevated blood-pressure reading, without diagnosis of hypertension: Secondary | ICD-10-CM | POA: Diagnosis not present

## 2019-08-08 DIAGNOSIS — M4327 Fusion of spine, lumbosacral region: Secondary | ICD-10-CM | POA: Diagnosis not present

## 2019-10-30 DIAGNOSIS — Z0000 Encounter for general adult medical examination without abnormal findings: Secondary | ICD-10-CM | POA: Diagnosis not present

## 2019-10-30 DIAGNOSIS — E559 Vitamin D deficiency, unspecified: Secondary | ICD-10-CM | POA: Diagnosis not present

## 2019-10-30 DIAGNOSIS — E039 Hypothyroidism, unspecified: Secondary | ICD-10-CM | POA: Diagnosis not present

## 2019-10-31 DIAGNOSIS — M4327 Fusion of spine, lumbosacral region: Secondary | ICD-10-CM | POA: Diagnosis not present

## 2019-11-01 DIAGNOSIS — E559 Vitamin D deficiency, unspecified: Secondary | ICD-10-CM | POA: Diagnosis not present

## 2019-11-01 DIAGNOSIS — E878 Other disorders of electrolyte and fluid balance, not elsewhere classified: Secondary | ICD-10-CM | POA: Diagnosis not present

## 2019-11-06 DIAGNOSIS — Z0000 Encounter for general adult medical examination without abnormal findings: Secondary | ICD-10-CM | POA: Diagnosis not present

## 2019-11-06 DIAGNOSIS — E039 Hypothyroidism, unspecified: Secondary | ICD-10-CM | POA: Diagnosis not present

## 2019-11-06 DIAGNOSIS — E875 Hyperkalemia: Secondary | ICD-10-CM | POA: Diagnosis not present

## 2019-11-06 DIAGNOSIS — R030 Elevated blood-pressure reading, without diagnosis of hypertension: Secondary | ICD-10-CM | POA: Diagnosis not present

## 2019-11-06 DIAGNOSIS — F988 Other specified behavioral and emotional disorders with onset usually occurring in childhood and adolescence: Secondary | ICD-10-CM | POA: Diagnosis not present

## 2019-12-03 ENCOUNTER — Other Ambulatory Visit: Payer: Self-pay | Admitting: Internal Medicine

## 2019-12-03 DIAGNOSIS — Z1231 Encounter for screening mammogram for malignant neoplasm of breast: Secondary | ICD-10-CM

## 2019-12-13 DIAGNOSIS — Z23 Encounter for immunization: Secondary | ICD-10-CM | POA: Diagnosis not present

## 2019-12-18 DIAGNOSIS — I700 Atherosclerosis of aorta: Secondary | ICD-10-CM | POA: Diagnosis not present

## 2019-12-27 IMAGING — CT CT L SPINE W/ CM
1 of 6 series · 3 of 14 positions shown, 4 images · non-contrast
Comparison: CT lumbar myelogram dated January 14, 2011.

CLINICAL DATA: Right posterolateral knee and calf pain with right
foot drop since knee arthroscopy in [REDACTED].
TECHNIQUE: Contiguous axial images were obtained through the lumbar spine after
the intrathecal infusion of contrast. Coronal and sagittal
reconstructions were obtained of the axial image sets.

[Series 4: l spine soft · axial · 0.30mm/px · z∈[-188,-86]mm · 3 of 68 slices shown, 4 images]
[im 17/68  soft-tissue]
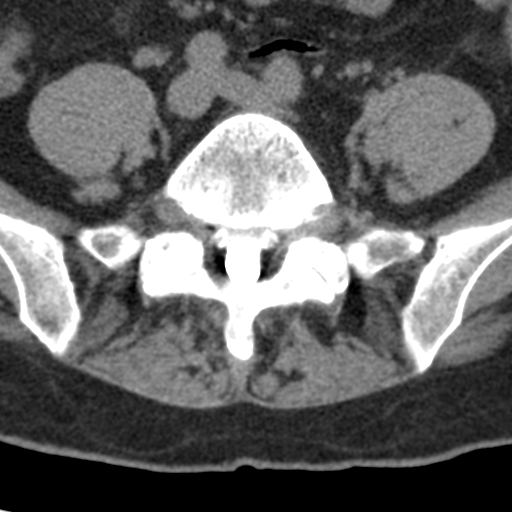
[im 17/68  bone]
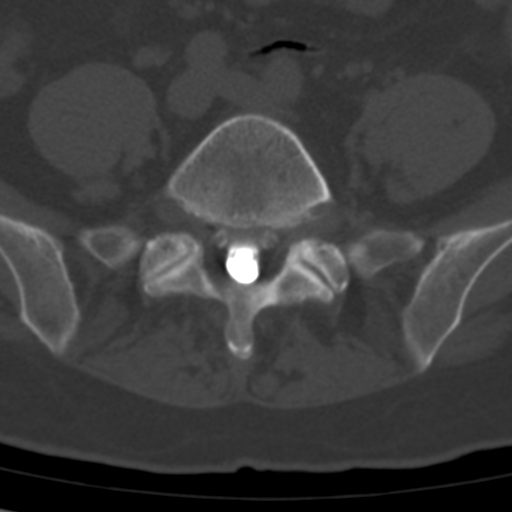
[im 34/68  bone]
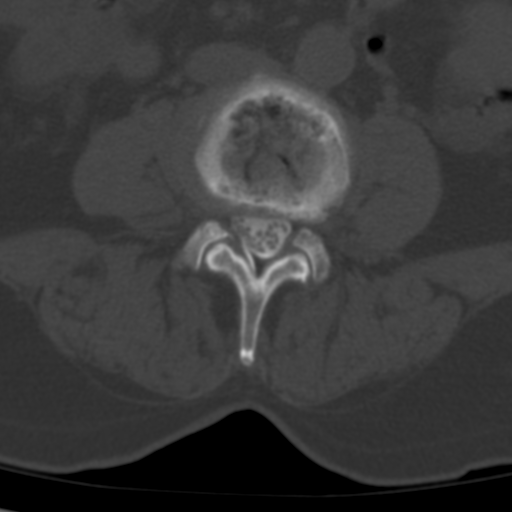
[im 51/68  bone]
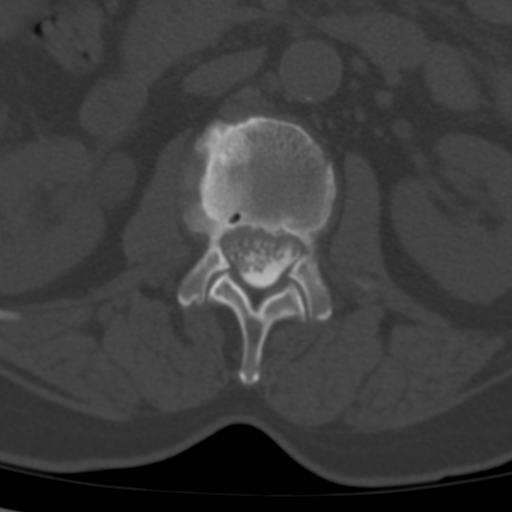

[3 of 14 positions shown; findings below may reference images not displayed]

EXAM:
LUMBAR MYELOGRAM

CT LUMBAR MYELOGRAM

FLUOROSCOPY TIME:  Radiation Exposure Index (as provided by the
fluoroscopic device): 20.2 mGy

Fluoroscopy Time:  58 seconds

Number of Acquired Images:  16

PROCEDURE:
After thorough discussion of risks and benefits of the procedure
including bleeding, infection, injury to nerves, blood vessels,
adjacent structures as well as headache and CSF leak, written and
oral informed consent was obtained. Consent was obtained by Dr.
Mechanika Samochodowa [place]. Time out form was completed.

Patient was positioned prone on the fluoroscopy table. Local
anesthesia was provided with 1% lidocaine without epinephrine after
prepped and draped in the usual sterile fashion. Puncture was
performed at L3-L4 using a 3 1/2 inch 22-gauge spinal needle via
right interlaminar approach. Using a single pass through the dura,
the needle was placed within the thecal sac, with return of clear
CSF. 15 mL of Isovue P-HQQ was injected into the thecal sac, with
normal opacification of the nerve roots and cauda equina consistent
with free flow within the subarachnoid space.

I personally performed the lumbar puncture and administered the
intrathecal contrast. I also personally supervised acquisition of
the myelogram images.
FINDINGS: LUMBAR MYELOGRAM FINDINGS:

Trace retrolisthesis at L1-L2 and L5-S1. No dynamic instability.
Small ventral extradural defects from L1-L2 through L5-S1 have
mildly increased in size since 5265. No spinal canal stenosis.
Unchanged slight underfilling of the bilateral S1 nerve roots.

CT LUMBAR MYELOGRAM FINDINGS:

Segmentation: Standard.

Alignment: Trace retrolisthesis at L1-L2 and L5-S1. New mild
levocurvature of the upper lumbar spine.

Vertebrae: No acute fracture or other focal pathologic process.

Conus medullaris and cauda equina: Conus extends to the L1 level.
Conus and cauda equina appear normal.

Paraspinal and other soft tissues: Aortoiliac atherosclerotic
vascular disease.

Disc levels:

T12-L1: New moderate disc height loss and mild diffuse disc bulging.
No stenosis.

L1-L2: Progressive moderate to severe disc height loss with small
circumferential disc osteophyte complex. New mild right
neuroforaminal stenosis. No spinal canal or left neuroforaminal
stenosis.

L2-L3: Unchanged moderate disc height loss with small
circumferential disc osteophyte complex. No stenosis.

L3-L4: Progressive moderate to severe disc height loss with small
circumferential disc osteophyte complex. New mild to moderate right
neuroforaminal stenosis. No spinal canal or left neuroforaminal
stenosis.

L4-L5: Progressive mild diffuse disc bulging with new superimposed
broad-based right foraminal and far lateral disc protrusion
contacting the exiting right L4 nerve root. Unchanged mild left
facet arthropathy. Unchanged mild right neuroforaminal stenosis. No
spinal canal or left neuroforaminal stenosis.

L5-S1: Progressive mild diffuse disc bulging new left foraminal and
far lateral endplate spurring. Unchanged mild bilateral facet
arthropathy. New mild to moderate left neuroforaminal stenosis. No
spinal canal or right neuroforaminal stenosis.
IMPRESSION: 1. Progressive multilevel lumbar spondylosis as described above. New
broad-based right foraminal and far lateral disc protrusion at L4-L5
contacting the exiting right L4 nerve root.
2. New mild to moderate neuroforaminal stenosis on the right at
L3-L4 and on the left at L5-S1.
3.  Aortic atherosclerosis ([id number]).

## 2019-12-31 ENCOUNTER — Other Ambulatory Visit: Payer: Self-pay

## 2019-12-31 ENCOUNTER — Ambulatory Visit
Admission: RE | Admit: 2019-12-31 | Discharge: 2019-12-31 | Disposition: A | Discharge status: Home / Self Care | Payer: BC Managed Care – PPO | Source: Ambulatory Visit | Attending: Internal Medicine | Admitting: Internal Medicine

## 2019-12-31 DIAGNOSIS — Z1231 Encounter for screening mammogram for malignant neoplasm of breast: Secondary | ICD-10-CM

## 2020-02-03 ENCOUNTER — Other Ambulatory Visit: Payer: Self-pay

## 2020-02-03 ENCOUNTER — Ambulatory Visit (INDEPENDENT_AMBULATORY_CARE_PROVIDER_SITE_OTHER): Payer: BC Managed Care – PPO | Admitting: Internal Medicine

## 2020-02-03 ENCOUNTER — Encounter: Payer: Self-pay | Admitting: Internal Medicine

## 2020-02-03 VITALS — BP 144/90 | HR 80 | Ht 63.0 in | Wt 154.0 lb

## 2020-02-03 DIAGNOSIS — R002 Palpitations: Secondary | ICD-10-CM

## 2020-02-03 DIAGNOSIS — I700 Atherosclerosis of aorta: Secondary | ICD-10-CM

## 2020-02-03 DIAGNOSIS — E785 Hyperlipidemia, unspecified: Secondary | ICD-10-CM

## 2020-02-03 NOTE — Progress Notes (Signed)
OFFICE CONSULT NOTE  Chief Complaint:  Aortic atherosclerosis  Primary Care Physician: Sherry Pretty, MD  HPI:  Sherry Pierce is a 64 y.o. female who is being seen today for the evaluation of aortic atherosclerosis at the request of Sherry Pretty, MD. This is a pleasant 64 year old female who had been found in the past to have aortic atherosclerosis on imaging studies of the abdomen when she was noted to have some diverticulitis. Other medical problems include elevated blood pressure without diagnosis of hypertension, hypothyroidism, ADD and panic attack. She had been noted to have some degree of dyslipidemia with total cholesterol 179, triglycerides 75, HDL 62 and LDL 102 in September. Repeat labs however just a month later showed an increase in LDL up to 134. This was concerning and prompted a prescription for rosuvastatin. She apparently took this for short period of time and then developed significant myalgias, leading to discontinuation of the medication. She is here to further understand her cardiovascular risk. She notes that family history is not significant for any early onset heart disease however she has 2 brothers both with some hypertension and one with atrial fibrillation. She apparently had an EKG done at an outside institution which showed a STEMI however she was not having any chest pain. An EKG here today shows an incomplete right bundle branch block with an RSR in V1 and V2 and slight ST segment elevation. These findings might suggest a concealed bypass tract such as Brugada type I. She reports overall feeling well. She denies chest pain or worsening shortness of breath. She has struggled with lumbar pain and then recently had a failed disc surgery and ultimately went to [facility] for lumbar spinal fusion.  PMHx:  Past Medical History:  Diagnosis Date  . Arthritis    lower back  . Degenerative disc disease   . Diverticulitis   . Hypertension   . Lipoma    rt side  .  Osteopenia   . Thyroid disease     Past Surgical History:  Procedure Laterality Date  . Arm fracture    . arthroscopy right knee  10/2018  . BREAST BIOPSY    . CERVICAL DISCECTOMY  2003  . CERVIX SURGERY  1987  . GYNECOLOGIC CRYOSURGERY    . HYSTEROSCOPY WITH D & C  1995  . LUMBAR LAMINECTOMY/DECOMPRESSION MICRODISCECTOMY Right 02/27/2019   Procedure: Right foraminal disectomy L4-5;  Surgeon: Melina Schools, MD;  Location: [facility];  Service: Orthopedics;  Laterality: Right;  2.5 hrs  . TUBAL LIGATION    . VAGINAL HYSTERECTOMY  1995   menorrhagia    FAMHx:  Family History  Problem Relation Age of Onset  . Hypertension Mother   . Rheum arthritis Mother   . Lupus Mother   . Hepatitis Mother        Hep C  . Parkinsonism Father   . Breast cancer Maternal Grandmother 41  . Cancer Maternal Grandmother        bone    SOCHx:   reports that she has quit smoking. She has never used smokeless tobacco. She reports current alcohol use. She reports that she does not use drugs.  ALLERGIES:  Allergies  Allergen Reactions  . Doxycycline Anaphylaxis and Hives  . Sulfa Antibiotics Other (See Comments)    "Extreme nausea, seems to back up into my esophagus"  . Ciprofloxacin Nausea And Vomiting  . Clarithromycin Nausea Only  . Erythrocin Nausea Only  . Metronidazole Nausea And Vomiting    ROS:  Pertinent items noted in HPI and remainder of comprehensive ROS otherwise negative.  HOME MEDS: Current Outpatient Medications on File Prior to Visit  Medication Sig Dispense Refill  . acetaminophen (TYLENOL) 500 MG tablet Take 500-1,000 mg by mouth every 6 (six) hours as needed (for pain.).    Marland Kitchen Cholecalciferol (VITAMIN D3) 50 MCG (2000 UT) TABS Take 2,000 Units by mouth daily.    Marland Kitchen gabapentin (NEURONTIN) 300 MG capsule Take 300 mg by mouth 3 (three) times daily as needed (nerve pain.).     Marland Kitchen ibuprofen (ADVIL) 800 MG tablet Take 800 mg by mouth every 8 (eight) hours as needed (pain.).    Marland Kitchen  levothyroxine (SYNTHROID, LEVOTHROID) 100 MCG tablet Take 100 mcg by mouth daily before breakfast.     . lisdexamfetamine (VYVANSE) 60 MG capsule Take 60 mg by mouth daily as needed (focus).      No current facility-administered medications on file prior to visit.    LABS/IMAGING: No results found for this or any previous visit (from the past 48 hour(s)). No results found.  LIPID PANEL: No results found for: CHOL, TRIG, HDL, CHOLHDL, VLDL, LDLCALC, LDLDIRECT  WEIGHTS: Wt Readings from Last 3 Encounters:  02/03/20 154 lb (69.9 kg)  02/27/19 153 lb 3.5 oz (69.5 kg)  02/19/19 153 lb 4.8 oz (69.5 kg)    VITALS: BP (!) 144/90   Pulse 80   Ht 5\' 3"  (1.6 m)   Wt 154 lb (69.9 kg)   BMI 27.28 kg/m   EXAM: General appearance: alert and no distress Neck: no carotid bruit, no JVD and thyroid not enlarged, symmetric, no tenderness/mass/nodules Lungs: clear to auscultation bilaterally Heart: regular rate and rhythm, S1, S2 normal, no murmur, click, rub or gallop Abdomen: soft, non-tender; bowel sounds normal; no masses,  no organomegaly Extremities: extremities normal, atraumatic, no cyanosis or edema Pulses: 2+ and symmetric Skin: Skin color, texture, turgor normal. No rashes or lesions Neurologic: Grossly normal Psych: Pleasant  EKG: Normal sinus rhythm at 80, incomplete right bundle branch block with RSR in V1 and V2 and slight ST elevation (? Type I Brugada)- personally reviewed  ASSESSMENT: 1. Aortic atherosclerosis 2. Possible statin intolerance 3. Abnormal EKG 4. ADHD 5. Palpatations  PLAN: 1.   [name] has known aortic atherosclerosis and possible statin intolerance. Up until recently she had good control over her lipids however may have had dietary indiscretions leading to higher LDL cholesterol. Due to the risk equivalent of aortic atherosclerosis, likely she will require statin therapy to target LDL less than 100 however I had like to further restratify her with a  calcium score to better understand her coronaries. Her EKG is abnormal and I was interested that the findings at an outside hospital indicated STEMI, however she had no symptoms. The EKG here does show some changes in V1 and V2, which could indicate a bypass tract pathway. She notes that when she has been in school she takes Vyvanase and it does cause her palpitations.  Plan follow-up with me after. Thanks again for the kind referral.  Pixie Casino, MD, FACC, [place] Director of the [facility] Diplomate of the American Board of Clinical Lipidology Attending Cardiologist  Direct Dial: [phone]  Fax: [phone]  Website:  [email or web address] 02/03/2020, 8:49 PM

## 2020-02-03 NOTE — Patient Instructions (Signed)
Medication Instructions:  Your physician recommends that you continue on your current medications as directed. Please refer to the Current Medication list given to you today.  *If you need a refill on your cardiac medications before your next appointment, please call your pharmacy*  Testing/Procedures: Dr. Debara Pickett has ordered a CT coronary calcium score. This test is done at [address]. This is $150 out of pocket.   Coronary CalciumScan A coronary calcium scan is an imaging test used to look for deposits of calcium and other fatty materials (plaques) in the inner lining of the blood vessels of the heart (coronary arteries). These deposits of calcium and plaques can partly clog and narrow the coronary arteries without producing any symptoms or warning signs. This puts a person at risk for a heart attack. This test can detect these deposits before symptoms develop. Tell a health care provider about:  Any allergies you have.  All medicines you are taking, including vitamins, herbs, eye drops, creams, and over-the-counter medicines.  Any problems you or family members have had with anesthetic medicines.  Any blood disorders you have.  Any surgeries you have had.  Any medical conditions you have.  Whether you are pregnant or may be pregnant. What are the risks? Generally, this is a safe procedure. However, problems may occur, including:  Harm to a pregnant woman and her unborn baby. This test involves the use of radiation. Radiation exposure can be dangerous to a pregnant woman and her unborn baby. If you are pregnant, you generally should not have this procedure done.  Slight increase in the risk of cancer. This is because of the radiation involved in the test. What happens before the procedure? No preparation is needed for this procedure. What happens during the procedure?  You will undress and remove any jewelry around your neck or chest.  You will put on a  hospital gown.  Sticky electrodes will be placed on your chest. The electrodes will be connected to an electrocardiogram (ECG) machine to record a tracing of the electrical activity of your heart.  A CT scanner will take pictures of your heart. During this time, you will be asked to lie still and hold your breath for 2-3 seconds while a picture of your heart is being taken. The procedure may vary among health care providers and hospitals. What happens after the procedure?  You can get dressed.  You can return to your normal activities.  It is up to you to get the results of your test. Ask your health care provider, or the department that is doing the test, when your results will be ready. Summary  A coronary calcium scan is an imaging test used to look for deposits of calcium and other fatty materials (plaques) in the inner lining of the blood vessels of the heart (coronary arteries).  Generally, this is a safe procedure. Tell your health care provider if you are pregnant or may be pregnant.  No preparation is needed for this procedure.  A CT scanner will take pictures of your heart.  You can return to your normal activities after the scan is done. This information is not intended to replace advice given to you by your health care provider. Make sure you discuss any questions you have with your health care provider. Document Released: 08/13/2007 Document Revised: 01/04/2016 Document Reviewed: 01/04/2016 Elsevier Interactive Patient Education  2017 [employer].     Follow-Up: At [facility], you and your health needs are  our priority.  As part of our continuing mission to provide you with exceptional heart care, we have created designated Provider Care Teams.  These Care Teams include your primary Cardiologist (physician) and Advanced Practice Providers (APPs -  Physician Assistants and Nurse Practitioners) who all work together to provide you with the care you need, when you need  it.  We recommend signing up for the patient portal called "MyChart".  Sign up information is provided on this After Visit Summary.  MyChart is used to connect with patients for Virtual Visits (Telemedicine).  Patients are able to view lab/test results, encounter notes, upcoming appointments, etc.  Non-urgent messages can be sent to your provider as well.   To learn more about what you can do with MyChart, go to [email or web address].    Your next appointment:   4 month(s)  The format for your next appointment:   In Person  Provider:   K. Mali Hilty, MD   Other Instructions

## 2020-02-24 ENCOUNTER — Other Ambulatory Visit: Payer: Self-pay

## 2020-02-24 ENCOUNTER — Ambulatory Visit (INDEPENDENT_AMBULATORY_CARE_PROVIDER_SITE_OTHER)
Admission: RE | Admit: 2020-02-24 | Discharge: 2020-02-24 | Disposition: A | Discharge status: Home / Self Care | Payer: Self-pay | Source: Ambulatory Visit | Attending: Internal Medicine | Admitting: Internal Medicine

## 2020-02-24 DIAGNOSIS — E785 Hyperlipidemia, unspecified: Secondary | ICD-10-CM

## 2020-02-24 DIAGNOSIS — I700 Atherosclerosis of aorta: Secondary | ICD-10-CM

## 2020-05-05 DIAGNOSIS — L820 Inflamed seborrheic keratosis: Secondary | ICD-10-CM | POA: Diagnosis not present

## 2020-05-05 DIAGNOSIS — D492 Neoplasm of unspecified behavior of bone, soft tissue, and skin: Secondary | ICD-10-CM | POA: Diagnosis not present

## 2020-05-07 DIAGNOSIS — M4327 Fusion of spine, lumbosacral region: Secondary | ICD-10-CM | POA: Diagnosis not present

## 2020-05-07 DIAGNOSIS — Z4782 Encounter for orthopedic aftercare following scoliosis surgery: Secondary | ICD-10-CM | POA: Diagnosis not present

## 2020-06-04 ENCOUNTER — Encounter: Payer: Self-pay | Admitting: Internal Medicine

## 2020-06-04 ENCOUNTER — Other Ambulatory Visit: Payer: Self-pay

## 2020-06-04 ENCOUNTER — Ambulatory Visit: Payer: BC Managed Care – PPO | Admitting: Internal Medicine

## 2020-06-04 ENCOUNTER — Ambulatory Visit (INDEPENDENT_AMBULATORY_CARE_PROVIDER_SITE_OTHER): Payer: PPO | Admitting: Internal Medicine

## 2020-06-04 VITALS — BP 130/70 | HR 77 | Ht 64.5 in | Wt 158.0 lb

## 2020-06-04 DIAGNOSIS — E785 Hyperlipidemia, unspecified: Secondary | ICD-10-CM | POA: Diagnosis not present

## 2020-06-04 DIAGNOSIS — R9431 Abnormal electrocardiogram [ECG] [EKG]: Secondary | ICD-10-CM

## 2020-06-04 DIAGNOSIS — I700 Atherosclerosis of aorta: Secondary | ICD-10-CM | POA: Diagnosis not present

## 2020-06-04 NOTE — Patient Instructions (Signed)
Medication Instructions:  Your physician recommends that you continue on your current medications as directed. Please refer to the Current Medication list given to you today.  *If you need a refill on your cardiac medications before your next appointment, please call your pharmacy*   Lab Work: FASTING lab work to check cholesterol   If you have labs (blood work) drawn today and your tests are completely normal, you will receive your results only by: Marland Kitchen MyChart Message (if you have MyChart) OR . A paper copy in the mail If you have any lab test that is abnormal or we need to change your treatment, we will call you to review the results.   Testing/Procedures: NONE   Follow-Up: At [facility], you and your health needs are our priority.  As part of our continuing mission to provide you with exceptional heart care, we have created designated Provider Care Teams.  These Care Teams include your primary Cardiologist (physician) and Advanced Practice Providers (APPs -  Physician Assistants and Nurse Practitioners) who all work together to provide you with the care you need, when you need it.  We recommend signing up for the patient portal called "MyChart".  Sign up information is provided on this After Visit Summary.  MyChart is used to connect with patients for Virtual Visits (Telemedicine).  Patients are able to view lab/test results, encounter notes, upcoming appointments, etc.  Non-urgent messages can be sent to your provider as well.   To learn more about what you can do with MyChart, go to [email or web address].    Your next appointment:   6 month(s)  The format for your next appointment:   In Person  Provider:   Dr. Debara Pickett   Other Instructions

## 2020-06-04 NOTE — Progress Notes (Signed)
OFFICE CONSULT NOTE  Chief Complaint:  Follow-up aortic atherosclerosis  Primary Care Physician: [name], MD  HPI:  Sherry Pierce is a 65 y.o. female who is being seen today for the evaluation of aortic atherosclerosis at the request of [name], MD. This is a pleasant 65 year old female who had been found in the past to have aortic atherosclerosis on imaging studies of the abdomen when she was noted to have some diverticulitis. Other medical problems include elevated blood pressure without diagnosis of hypertension, hypothyroidism, ADD and panic attack. She had been noted to have some degree of dyslipidemia with total cholesterol 179, triglycerides 75, HDL 62 and LDL 102 in September. Repeat labs however just a month later showed an increase in LDL up to 134. This was concerning and prompted a prescription for rosuvastatin. She apparently took this for short period of time and then developed significant myalgias, leading to discontinuation of the medication. She is here to further understand her cardiovascular risk. She notes that family history is not significant for any early onset heart disease however she has 2 brothers both with some hypertension and one with atrial fibrillation. She apparently had an EKG done at an outside institution which showed a STEMI however she was not having any chest pain. An EKG here today shows an incomplete right bundle branch block with an RSR in V1 and V2 and slight ST segment elevation. These findings might suggest a concealed bypass tract such as Brugada type I. She reports overall feeling well. She denies chest pain or worsening shortness of breath. She has struggled with lumbar pain and then recently had a failed disc surgery and ultimately went to [facility] for lumbar spinal fusion.  06/04/2020  Sherry Pierce returns today for follow-up.  She had cholesterol testing in October which showed an LDL of 134.  In the interim we performed a calcium score which  showed no evidence of coronary calcium however she does have aortic atherosclerosis.  I have argued that her LDL cholesterol target should be less than 100.  Today we discussed the possibility of medication however she is very reluctant to try statin or an alternative treatment at this point.  She wants to work on continued diet and physical activity.  PMHx:  Past Medical History:  Diagnosis Date  . Arthritis    lower back  . Degenerative disc disease   . Diverticulitis   . Hypertension   . Lipoma    rt side  . Osteopenia   . Thyroid disease     Past Surgical History:  Procedure Laterality Date  . Arm fracture    . arthroscopy right knee  10/2018  . BREAST BIOPSY    . CERVICAL DISCECTOMY  2003  . CERVIX SURGERY  1987  . GYNECOLOGIC CRYOSURGERY    . HYSTEROSCOPY WITH D & C  1995  . LUMBAR LAMINECTOMY/DECOMPRESSION MICRODISCECTOMY Right 02/27/2019   Procedure: Right foraminal disectomy L4-5;  Surgeon: Melina Schools, MD;  Location: [place];  Service: Orthopedics;  Laterality: Right;  2.5 hrs  . TUBAL LIGATION    . VAGINAL HYSTERECTOMY  1995   menorrhagia    FAMHx:  Family History  Problem Relation Age of Onset  . Hypertension Mother   . Rheum arthritis Mother   . Lupus Mother   . Hepatitis Mother        Hep C  . Parkinsonism Father   . Breast cancer Maternal Grandmother 57  . Cancer Maternal Grandmother  bone    SOCHx:   reports that she has quit smoking. She has never used smokeless tobacco. She reports current alcohol use. She reports that she does not use drugs.  ALLERGIES:  Allergies  Allergen Reactions  . Doxycycline Anaphylaxis and Hives  . Sulfa Antibiotics Other (See Comments)    "Extreme nausea, seems to back up into my esophagus"  . Codeine Nausea Only    Other reaction(s): Unknown  . Ciprofloxacin Nausea And Vomiting  . Clarithromycin Nausea Only  . Erythrocin Nausea Only  . Metronidazole Nausea And Vomiting    ROS: Pertinent items noted in  HPI and remainder of comprehensive ROS otherwise negative.  HOME MEDS: Current Outpatient Medications on File Prior to Visit  Medication Sig Dispense Refill  . acetaminophen (TYLENOL) 500 MG tablet Take 500-1,000 mg by mouth every 6 (six) hours as needed (for pain.).    Marland Kitchen amphetamine-dextroamphetamine (ADDERALL XR) 30 MG 24 hr capsule 1 capsule in the morning    . Cholecalciferol 50 MCG (2000 UT) CAPS 1 tablet    . ibuprofen (ADVIL) 800 MG tablet Take 800 mg by mouth every 8 (eight) hours as needed (pain.).    Marland Kitchen levothyroxine (SYNTHROID, LEVOTHROID) 100 MCG tablet Take 100 mcg by mouth daily before breakfast.     No current facility-administered medications on file prior to visit.    LABS/IMAGING: No results found for this or any previous visit (from the past 48 hour(s)). No results found.  LIPID PANEL: No results found for: CHOL, TRIG, HDL, CHOLHDL, VLDL, LDLCALC, LDLDIRECT  WEIGHTS: Wt Readings from Last 3 Encounters:  06/04/20 158 lb (71.7 kg)  02/03/20 154 lb (69.9 kg)  02/27/19 153 lb 3.5 oz (69.5 kg)    VITALS: BP 130/70   Pulse 77   Ht 5' 4.5" (1.638 m)   Wt 158 lb (71.7 kg)   SpO2 96%   BMI 26.70 kg/m   EXAM: General appearance: alert and no distress Neck: no carotid bruit, no JVD and thyroid not enlarged, symmetric, no tenderness/mass/nodules Lungs: clear to auscultation bilaterally Heart: regular rate and rhythm, S1, S2 normal, no murmur, click, rub or gallop Abdomen: soft, non-tender; bowel sounds normal; no masses,  no organomegaly Extremities: extremities normal, atraumatic, no cyanosis or edema Pulses: 2+ and symmetric Skin: Skin color, texture, turgor normal. No rashes or lesions Neurologic: Grossly normal Psych: Pleasant  EKG: Normal sinus rhythm at 77, incomplete right bundle branch block with RSR in V1 and V2 and slight ST elevation (? Type I Brugada)- personally reviewed  ASSESSMENT: 1. Aortic atherosclerosis -0 CAC score is  01/2020) 2. Possible statin intolerance 3. Abnormal EKG -? type I Brugada 4. ADHD  PLAN: 1.   Sherry Pierce remains above target LDL less than 100.  Fortunately she had no coronary calcium.  She wishes to continue to work with diet and exercise to try to lower her lipids further before considering medical therapy for this.  We will plan repeat lipids in about 6 months.  Previously she had reported palpitations however she says she has had tachycardia but no real palpitations in the past.  She does have somewhat of an abnormal EKG concerning for possible preexcitation however again she says she is not having active palpitations.  Plan follow-up with me after repeat lipids.Pixie Casino, MD, [facility], [place] Director of the [facility]  [facility] Diplomate of the American Board of Clinical Lipidology Attending  Cardiologist  Direct Dial: [phone]  Fax: [phone]  Website:  [email or web address] 06/04/2020, 2:43 PM

## 2020-08-13 DIAGNOSIS — F902 Attention-deficit hyperactivity disorder, combined type: Secondary | ICD-10-CM | POA: Diagnosis not present

## 2020-09-10 DIAGNOSIS — H2513 Age-related nuclear cataract, bilateral: Secondary | ICD-10-CM | POA: Diagnosis not present

## 2020-09-18 DIAGNOSIS — H2513 Age-related nuclear cataract, bilateral: Secondary | ICD-10-CM | POA: Diagnosis not present

## 2020-11-03 DIAGNOSIS — E559 Vitamin D deficiency, unspecified: Secondary | ICD-10-CM | POA: Diagnosis not present

## 2020-11-03 DIAGNOSIS — Z0000 Encounter for general adult medical examination without abnormal findings: Secondary | ICD-10-CM | POA: Diagnosis not present

## 2020-11-03 DIAGNOSIS — R030 Elevated blood-pressure reading, without diagnosis of hypertension: Secondary | ICD-10-CM | POA: Diagnosis not present

## 2020-11-03 DIAGNOSIS — E039 Hypothyroidism, unspecified: Secondary | ICD-10-CM | POA: Diagnosis not present

## 2020-11-03 DIAGNOSIS — Z79899 Other long term (current) drug therapy: Secondary | ICD-10-CM | POA: Diagnosis not present

## 2020-11-09 DIAGNOSIS — L570 Actinic keratosis: Secondary | ICD-10-CM | POA: Diagnosis not present

## 2020-11-09 DIAGNOSIS — E039 Hypothyroidism, unspecified: Secondary | ICD-10-CM | POA: Diagnosis not present

## 2020-11-09 DIAGNOSIS — Z86010 Personal history of colonic polyps: Secondary | ICD-10-CM | POA: Diagnosis not present

## 2020-11-09 DIAGNOSIS — R9431 Abnormal electrocardiogram [ECG] [EKG]: Secondary | ICD-10-CM | POA: Diagnosis not present

## 2020-11-09 DIAGNOSIS — I700 Atherosclerosis of aorta: Secondary | ICD-10-CM | POA: Diagnosis not present

## 2020-11-09 DIAGNOSIS — F902 Attention-deficit hyperactivity disorder, combined type: Secondary | ICD-10-CM | POA: Diagnosis not present

## 2020-11-09 DIAGNOSIS — M8580 Other specified disorders of bone density and structure, unspecified site: Secondary | ICD-10-CM | POA: Diagnosis not present

## 2020-11-09 DIAGNOSIS — R030 Elevated blood-pressure reading, without diagnosis of hypertension: Secondary | ICD-10-CM | POA: Diagnosis not present

## 2020-11-09 DIAGNOSIS — Z23 Encounter for immunization: Secondary | ICD-10-CM | POA: Diagnosis not present

## 2020-11-09 DIAGNOSIS — Z0000 Encounter for general adult medical examination without abnormal findings: Secondary | ICD-10-CM | POA: Diagnosis not present

## 2020-11-10 DIAGNOSIS — H25043 Posterior subcapsular polar age-related cataract, bilateral: Secondary | ICD-10-CM | POA: Diagnosis not present

## 2020-11-10 DIAGNOSIS — H2513 Age-related nuclear cataract, bilateral: Secondary | ICD-10-CM | POA: Diagnosis not present

## 2020-11-10 DIAGNOSIS — H18413 Arcus senilis, bilateral: Secondary | ICD-10-CM | POA: Diagnosis not present

## 2020-11-10 DIAGNOSIS — H2511 Age-related nuclear cataract, right eye: Secondary | ICD-10-CM | POA: Diagnosis not present

## 2020-11-10 DIAGNOSIS — H25013 Cortical age-related cataract, bilateral: Secondary | ICD-10-CM | POA: Diagnosis not present

## 2020-11-27 ENCOUNTER — Ambulatory Visit: Payer: PPO | Admitting: Cardiology

## 2020-11-27 ENCOUNTER — Encounter: Payer: Self-pay | Admitting: Cardiology

## 2020-11-27 ENCOUNTER — Other Ambulatory Visit: Payer: Self-pay

## 2020-11-27 VITALS — BP 132/72 | HR 85 | Temp 98.0°F | Resp 16 | Ht 64.5 in | Wt 158.2 lb

## 2020-11-27 DIAGNOSIS — I10 Essential (primary) hypertension: Secondary | ICD-10-CM

## 2020-11-27 DIAGNOSIS — E785 Hyperlipidemia, unspecified: Secondary | ICD-10-CM | POA: Diagnosis not present

## 2020-11-27 DIAGNOSIS — R9431 Abnormal electrocardiogram [ECG] [EKG]: Secondary | ICD-10-CM | POA: Diagnosis not present

## 2020-11-27 DIAGNOSIS — I700 Atherosclerosis of aorta: Secondary | ICD-10-CM | POA: Diagnosis not present

## 2020-11-27 MED ORDER — SIMVASTATIN 10 MG PO TABS: 10.0000 mg | ORAL_TABLET | Freq: Every day | ORAL | 3 refills | Status: DC

## 2020-11-27 NOTE — Progress Notes (Signed)
Primary Physician/Referring:  [name], MD  Patient ID: Sherry Pierce, female    DOB: 1955-04-04, 65 y.o.   MRN: [id number]  Chief Complaint  Patient presents with   Abnormal ECG   New Patient (Initial Visit)    Referred by Dr. [name], MD    HPI:    Aden Youngman  is a 65 y.o. Caucasian female patient with mild hyperlipidemia and aortic atherosclerosis referred to me for evaluation of abnormal EKG.  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  She continues to remain active and is completely asymptomatic.   Past Medical History:  Diagnosis Date   Arthritis    lower back   Degenerative disc disease    Diverticulitis    Hypertension    Lipoma    rt side   Osteopenia    Thyroid disease    Past Surgical History:  Procedure Laterality Date   Arm fracture     arthroscopy right knee  10/2018   BREAST BIOPSY     CERVICAL DISCECTOMY  2003   CERVIX SURGERY  1987   GYNECOLOGIC CRYOSURGERY     HYSTEROSCOPY WITH D & C  1995   LUMBAR LAMINECTOMY/DECOMPRESSION MICRODISCECTOMY Right 02/27/2019   Procedure: Right foraminal disectomy L4-5;  Surgeon: Melina Schools, MD;  Location: [place];  Service: Orthopedics;  Laterality: Right;  2.5 hrs   TUBAL LIGATION     VAGINAL HYSTERECTOMY  1995   menorrhagia   Family History  Problem Relation Age of Onset   Hypertension Mother    Rheum arthritis Mother    Lupus Mother    Hepatitis Mother        Hep C   Parkinsonism Father    Breast cancer Maternal Grandmother 103   Cancer Maternal Grandmother        bone    Social History   Tobacco Use   Smoking status: Former    Packs/day: 0.25    Years: 18.00    Pack years: 4.50    Types: Cigarettes    Quit date: 1982    Years since quitting: 40.7   Smokeless tobacco: Never  Substance Use Topics   Alcohol use: Yes    Comment: not often   Marital Status: Married  ROS  Review of Systems  Cardiovascular:  Negative for chest  pain, dyspnea on exertion and leg swelling.  Gastrointestinal:  Negative for melena.  Objective  Blood pressure 132/72, pulse 85, temperature 98 F (36.7 C), temperature source Temporal, resp. rate 16, height 5' 4.5" (1.638 m), weight 158 lb 3.2 oz (71.8 kg), SpO2 96 %. Body mass index is 26.74 kg/m.  Vitals with BMI 11/27/2020 06/04/2020 02/03/2020  Height 5' 4.5" 5' 4.5" 5\' 3"   Weight 158 lbs 3 oz 158 lbs 154 lbs  BMI 26.75 30.86 57.84  Systolic 696 295 284  Diastolic 72 70 90  Pulse 85 77 80     Physical Exam Neck:     Vascular: No carotid bruit or JVD.  Cardiovascular:     Rate and Rhythm: Normal rate and regular rhythm.     Pulses: Intact distal pulses.     Heart sounds: Normal heart sounds. No murmur heard.   No gallop.  Pulmonary:     Effort: Pulmonary effort is normal.     Breath sounds: Normal breath sounds.  Abdominal:     General: Bowel sounds are normal.     Palpations: Abdomen  is soft.  Musculoskeletal:        General: No swelling.     Laboratory examination:   No results for input(s): NA, K, CL, CO2, GLUCOSE, BUN, CREATININE, CALCIUM, GFRNONAA, GFRAA in the last 8760 hours. CrCl cannot be calculated (Patient's most recent lab result is older than the maximum 21 days allowed.).  CMP Latest Ref Rng & Units 02/19/2019  Glucose 70 - 99 mg/dL 76  BUN 8 - 23 mg/dL 14  Creatinine 0.44 - 1.00 mg/dL 0.77  Sodium 135 - 145 mmol/L 140  Potassium 3.5 - 5.1 mmol/L 3.7  Chloride 98 - 111 mmol/L 105  CO2 22 - 32 mmol/L 28  Calcium 8.9 - 10.3 mg/dL 9.5   CBC Latest Ref Rng & Units 02/19/2019  WBC 4.0 - 10.5 K/uL 5.6  Hemoglobin 12.0 - 15.0 g/dL 14.5  Hematocrit 36.0 - 46.0 % 44.6  Platelets 150 - 400 K/uL 324   External labs:   Labs 11/03/2020:  Total cholesterol 204, triglycerides 92, HDL 58, LDL 128.  Non-HDL cholesterol 146.  TSH 1.85, normal, vitamin D 26.4, mildly reduced.  Sodium 143, potassium 5.1, BUN 12, creatinine 0.4, serum glucose 87 mg.  CMP  normal.  Medications and allergies   Allergies  Allergen Reactions   Doxycycline Anaphylaxis and Hives   Sulfa Antibiotics Other (See Comments)    "Extreme nausea, seems to back up into my esophagus"   Codeine Nausea Only    Other reaction(s): Unknown   Ciprofloxacin Nausea And Vomiting   Clarithromycin Nausea Only   Erythrocin Nausea Only   Metronidazole Nausea And Vomiting     Medication prior to this encounter:   Outpatient Medications Prior to Visit  Medication Sig Dispense Refill   acetaminophen (TYLENOL) 500 MG tablet Take 500-1,000 mg by mouth every 6 (six) hours as needed (for pain.).     amphetamine-dextroamphetamine (ADDERALL XR) 30 MG 24 hr capsule 1 capsule in the morning     Cholecalciferol 50 MCG (2000 UT) CAPS 1 tablet     ibuprofen (ADVIL) 800 MG tablet Take 800 mg by mouth every 8 (eight) hours as needed (pain.).     levothyroxine (SYNTHROID, LEVOTHROID) 100 MCG tablet Take 100 mcg by mouth daily before breakfast.     No facility-administered medications prior to visit.     Medication list after today's encounter   Current Outpatient Medications  Medication Instructions   acetaminophen (TYLENOL) 500-1,000 mg, Oral, Every 6 hours PRN   amphetamine-dextroamphetamine (ADDERALL XR) 30 MG 24 hr capsule 1 capsule in the morning   Cholecalciferol 50 MCG (2000 UT) CAPS 1 tablet   ibuprofen (ADVIL) 800 mg, Oral, Every 8 hours PRN   levothyroxine (SYNTHROID) 100 mcg, Oral, Daily before breakfast,     simvastatin (ZOCOR) 10 mg, Oral, Daily at bedtime    Radiology:   No results found.  Cardiac Studies:   Chronic calcium score 03-18-2021: Calcium score of 0.  Visualized noncardiac structures no significant abnormality.  Mild aortic atherosclerosis.  EKG:   EKG 11/27/2020: Normal sinus rhythm at rate of 69 bpm, incomplete right bundle branch block.  Minimal ST elevation in aVR, consider Brugada syndrome. No significant change from from prior EKG.   Assessment      ICD-10-CM   1. Abnormal EKG  R94.31 EKG 12-Lead    PCV ECHOCARDIOGRAM COMPLETE    2. Mild hyperlipidemia  E78.5 simvastatin (ZOCOR) 10 MG tablet    3. Aortic atherosclerosis (HCC)  I70.0 simvastatin (ZOCOR) 10 MG tablet  There are no discontinued medications.  Meds ordered this encounter  Medications   simvastatin (ZOCOR) 10 MG tablet    Sig: Take 1 tablet (10 mg total) by mouth at bedtime.    Dispense:  90 tablet    Refill:  3    Orders Placed This Encounter  Procedures   EKG 12-Lead   PCV ECHOCARDIOGRAM COMPLETE    Standing Status:   Future    Standing Expiration Date:   11/27/2021    Recommendations:   Chaunta Bejarano is a 65 y.o. Caucasian female patient with mild hyperlipidemia and aortic atherosclerosis referred to me for evaluation of abnormal EKG.  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  She continues to remain active and is completely asymptomatic.  I reviewed her EKG, it does have Brugada like ST elevation in aVR however there is no family history of sudden cardiac death, patient is >10 years of age, she has no syncope no dizziness.  She continues to remain active and is completely asymptomatic.  Hence I do not think this is Brugada syndrome and would not recommend any further evaluation in the absence of symptoms and family history.  With regard to hyperlipidemia, in view of mild aortic atherosclerosis although her coronary calcium score was 0, I have recommended that she start Zocor 10 mg daily at the lowest dose, she did not tolerate Crestor in the past.  Patient is willing to try low-dose and advised her that if she has any side effects she can discontinue the medication.  Shared decision making regarding treatment of hyperlipidemia.  Blood pressure is well controlled.  Otherwise her examination is completely normal.  I will perform an echocardiogram to exclude any structural abnormality as she has not had  any structural evaluation and unless this is abnormal, I will see her back on a as needed basis.    Adrian Prows, MD, [facility] 11/28/2020, 3:15 PM Office: [phone]

## 2020-11-30 ENCOUNTER — Telehealth: Payer: Self-pay | Admitting: Cardiology

## 2020-11-30 NOTE — Telephone Encounter (Signed)
Patient asking about when to take simvastatin 10 mg. 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 When should she take the medication?

## 2020-12-01 DIAGNOSIS — I10 Essential (primary) hypertension: Secondary | ICD-10-CM | POA: Diagnosis not present

## 2020-12-08 ENCOUNTER — Other Ambulatory Visit: Payer: Self-pay

## 2020-12-08 ENCOUNTER — Ambulatory Visit: Payer: PPO

## 2020-12-08 DIAGNOSIS — R9431 Abnormal electrocardiogram [ECG] [EKG]: Secondary | ICD-10-CM | POA: Diagnosis not present

## 2020-12-10 ENCOUNTER — Other Ambulatory Visit: Payer: Self-pay

## 2020-12-10 ENCOUNTER — Emergency Department (HOSPITAL_COMMUNITY)
Admission: EM | Admit: 2020-12-10 | Discharge: 2020-12-11 | Disposition: A | Discharge status: Home / Self Care | Payer: PPO | Attending: Emergency Medicine | Admitting: Emergency Medicine

## 2020-12-10 ENCOUNTER — Emergency Department (HOSPITAL_COMMUNITY): Payer: PPO

## 2020-12-10 ENCOUNTER — Encounter (HOSPITAL_COMMUNITY): Payer: Self-pay | Admitting: Emergency Medicine

## 2020-12-10 DIAGNOSIS — R42 Dizziness and giddiness: Secondary | ICD-10-CM | POA: Insufficient documentation

## 2020-12-10 DIAGNOSIS — R6884 Jaw pain: Secondary | ICD-10-CM | POA: Diagnosis not present

## 2020-12-10 DIAGNOSIS — R112 Nausea with vomiting, unspecified: Secondary | ICD-10-CM | POA: Diagnosis not present

## 2020-12-10 DIAGNOSIS — R079 Chest pain, unspecified: Secondary | ICD-10-CM | POA: Diagnosis not present

## 2020-12-10 DIAGNOSIS — I10 Essential (primary) hypertension: Secondary | ICD-10-CM | POA: Diagnosis not present

## 2020-12-10 DIAGNOSIS — M79602 Pain in left arm: Secondary | ICD-10-CM | POA: Insufficient documentation

## 2020-12-10 DIAGNOSIS — M791 Myalgia, unspecified site: Secondary | ICD-10-CM

## 2020-12-10 DIAGNOSIS — Z87891 Personal history of nicotine dependence: Secondary | ICD-10-CM | POA: Diagnosis not present

## 2020-12-10 DIAGNOSIS — R519 Headache, unspecified: Secondary | ICD-10-CM | POA: Insufficient documentation

## 2020-12-10 DIAGNOSIS — M25512 Pain in left shoulder: Secondary | ICD-10-CM | POA: Diagnosis not present

## 2020-12-10 DIAGNOSIS — Z20822 Contact with and (suspected) exposure to covid-19: Secondary | ICD-10-CM | POA: Insufficient documentation

## 2020-12-10 DIAGNOSIS — M25511 Pain in right shoulder: Secondary | ICD-10-CM | POA: Diagnosis not present

## 2020-12-10 DIAGNOSIS — R531 Weakness: Secondary | ICD-10-CM | POA: Diagnosis not present

## 2020-12-10 DIAGNOSIS — R0981 Nasal congestion: Secondary | ICD-10-CM | POA: Insufficient documentation

## 2020-12-10 DIAGNOSIS — M79601 Pain in right arm: Secondary | ICD-10-CM | POA: Diagnosis not present

## 2020-12-10 LAB — CBC
HCT: 42.8 % (ref 36.0–46.0)
Hemoglobin: 14.1 g/dL (ref 12.0–15.0)
MCH: 29.2 pg (ref 26.0–34.0)
MCHC: 32.9 g/dL (ref 30.0–36.0)
MCV: 88.6 fL (ref 80.0–100.0)
Platelets: 289 10*3/uL (ref 150–400)
RBC: 4.83 MIL/uL (ref 3.87–5.11)
RDW: 13.3 % (ref 11.5–15.5)
WBC: 6.5 10*3/uL (ref 4.0–10.5)
nRBC: 0 % (ref 0.0–0.2)

## 2020-12-10 LAB — BASIC METABOLIC PANEL
Anion gap: 5 (ref 5–15)
BUN: 17 mg/dL (ref 8–23)
CO2: 25 mmol/L (ref 22–32)
Calcium: 9.2 mg/dL (ref 8.9–10.3)
Chloride: 107 mmol/L (ref 98–111)
Creatinine, Ser: 0.77 mg/dL (ref 0.44–1.00)
GFR, Estimated: >60 mL/min (ref >60)
Glucose, Bld: 96 mg/dL (ref 70–99)
Potassium: 3.7 mmol/L (ref 3.5–5.1)
Sodium: 137 mmol/L (ref 135–145)

## 2020-12-10 LAB — TROPONIN I (HIGH SENSITIVITY): Troponin I (High Sensitivity): 2 ng/L (ref <18)

## 2020-12-10 NOTE — ED Triage Notes (Addendum)
Patient reports dizziness, loss of balance, headache, jaw ache, shoulder ache, chest pain and high blood pressure. Symptoms started Tuesday.

## 2020-12-11 ENCOUNTER — Emergency Department (HOSPITAL_COMMUNITY): Payer: PPO

## 2020-12-11 ENCOUNTER — Encounter (HOSPITAL_COMMUNITY): Payer: Self-pay | Admitting: Emergency Medicine

## 2020-12-11 DIAGNOSIS — R42 Dizziness and giddiness: Secondary | ICD-10-CM | POA: Diagnosis not present

## 2020-12-11 LAB — RESP PANEL BY RT-PCR (FLU A&B, COVID) ARPGX2
Influenza A by PCR: NEGATIVE
Influenza B by PCR: NEGATIVE
SARS Coronavirus 2 by RT PCR: NEGATIVE

## 2020-12-11 LAB — TROPONIN I (HIGH SENSITIVITY): Troponin I (High Sensitivity): 2 ng/L (ref <18)

## 2020-12-11 MED ORDER — MAGNESIUM SULFATE 2 GM/50ML IV SOLN
2.0000 g | Freq: Once | INTRAVENOUS | Status: AC
  Administered 2020-12-11: 2 g via INTRAVENOUS
  Filled 2020-12-11: qty 50

## 2020-12-11 MED ORDER — IOHEXOL 350 MG/ML SOLN
80.0000 mL | Freq: Once | INTRAVENOUS | Status: AC | PRN
  Administered 2020-12-11: 80 mL via INTRAVENOUS

## 2020-12-11 MED ORDER — SODIUM CHLORIDE (PF) 0.9 % IJ SOLN
INTRAMUSCULAR | Status: AC
  Filled 2020-12-11: qty 50

## 2020-12-11 MED ORDER — MECLIZINE HCL 25 MG PO TABS
25.0000 mg | ORAL_TABLET | Freq: Once | ORAL | Status: DC
  Filled 2020-12-11: qty 1

## 2020-12-11 MED ORDER — MECLIZINE HCL 25 MG PO TABS
12.5000 mg | ORAL_TABLET | Freq: Once | ORAL | Status: AC
  Administered 2020-12-11: 12.5 mg via ORAL
  Filled 2020-12-11: qty 1

## 2020-12-11 MED ORDER — KETOROLAC TROMETHAMINE 30 MG/ML IJ SOLN
30.0000 mg | Freq: Once | INTRAMUSCULAR | Status: AC
  Administered 2020-12-11: 30 mg via INTRAVENOUS
  Filled 2020-12-11: qty 1

## 2020-12-11 NOTE — ED Provider Notes (Addendum)
[facility] DEPT Provider Note   CSN: [id number] Arrival date & time: 12/10/20  2008     History Chief Complaint  Patient presents with   Headache   Nausea   Dizziness   Chest Pain    Sherry Pierce is a 65 y.o. female.  The history is provided by the patient.  Headache Pain location:  Frontal Quality:  Dull Radiates to:  Does not radiate Severity currently:  4/10 Severity at highest:  6/10 Onset quality:  Gradual Duration: several days. Timing:  Constant Progression:  Improving Chronicity:  New Context: not activity   Relieved by:  Nothing Worsened by:  Nothing Ineffective treatments:  None tried Associated symptoms: congestion   Associated symptoms: no drainage, no ear pain, no facial pain, no hearing loss and no sinus pressure   Associated symptoms comment:  Myalgias, shoulders arms jaw Risk factors: no family hx of SAH   Dizziness Quality:  Head spinning Severity:  Moderate Onset quality:  Sudden Duration:  3 days Timing:  Constant Progression:  Unchanged Chronicity:  New Relieved by:  Nothing Worsened by:  Nothing Ineffective treatments:  None tried Associated symptoms: no hearing loss, no shortness of breath and no tinnitus   Risk factors: no Meniere's disease and no new medications   Risk factors comment:  New statin Patient HTN and hyperlipidemia with pain in B arms jaw shoulders and upper chest B for several days.  Also having dizziness     Past Medical History:  Diagnosis Date   Arthritis    lower back   Degenerative disc disease    Diverticulitis    Hypertension    Lipoma    rt side   Osteopenia    Thyroid disease     Patient Active Problem List   Diagnosis Date Noted   Lipoma of flank 09/08/2010    Past Surgical History:  Procedure Laterality Date   Arm fracture     arthroscopy right knee  10/2018   BREAST BIOPSY     CERVICAL DISCECTOMY  2003   CERVIX SURGERY  1987   GYNECOLOGIC CRYOSURGERY      HYSTEROSCOPY WITH D & C  1995   LUMBAR LAMINECTOMY/DECOMPRESSION MICRODISCECTOMY Right 02/27/2019   Procedure: Right foraminal disectomy L4-5;  Surgeon: Melina Schools, MD;  Location: [facility];  Service: Orthopedics;  Laterality: Right;  2.5 hrs   TUBAL LIGATION     VAGINAL HYSTERECTOMY  1995   menorrhagia     OB History     Gravida  3   Para  2   Term      Preterm      AB  1   Living  2      SAB  1   IAB      Ectopic      Multiple      Live Births              Family History  Problem Relation Age of Onset   Hypertension Mother    Rheum arthritis Mother    Lupus Mother    Hepatitis Mother        Hep C   Parkinsonism Father    Breast cancer Maternal Grandmother 20   Cancer Maternal Grandmother        bone    Social History   Tobacco Use   Smoking status: Former    Packs/day: 0.25    Years: 18.00    Pack years: 4.50  Types: Cigarettes    Quit date: 48    Years since quitting: 40.8   Smokeless tobacco: Never  Vaping Use   Vaping Use: Never used  Substance Use Topics   Alcohol use: Yes    Comment: not often   Drug use: No    Home Medications Prior to Admission medications   Medication Sig Start Date End Date Taking? Authorizing Provider  acetaminophen (TYLENOL) 500 MG tablet Take 500-1,000 mg by mouth every 6 (six) hours as needed (for pain.).   Yes [provider]  amphetamine-dextroamphetamine (ADDERALL XR) 30 MG 24 hr capsule Take 30 mg by mouth in the morning. 04/15/20  Yes [provider]  Cholecalciferol 50 MCG (2000 UT) CAPS Take 2,000 Units by mouth daily.   Yes [provider]  ibuprofen (ADVIL) 800 MG tablet Take 800 mg by mouth every 8 (eight) hours as needed (pain.).   Yes [provider]  levothyroxine (SYNTHROID, LEVOTHROID) 100 MCG tablet Take 100 mcg by mouth daily before breakfast.   Yes [provider]  simvastatin (ZOCOR) 10 MG tablet Take 1 tablet (10 mg total) by mouth at  bedtime. Patient taking differently: Take 10 mg by mouth in the morning. 11/27/20 11/22/21 Yes Adrian Prows, MD    Allergies    Doxycycline, Sulfa antibiotics, Codeine, Ciprofloxacin, Clarithromycin, Erythrocin, and Metronidazole  Review of Systems   Review of Systems  HENT:  Positive for congestion. Negative for ear pain, hearing loss, postnasal drip, sinus pressure and tinnitus.   Respiratory:  Negative for shortness of breath, wheezing and stridor.   All other systems reviewed and are negative.  Physical Exam Updated Vital Signs BP (!) 149/100   Pulse 79   Temp 98.8 F (37.1 C) (Oral)   Resp 17   Ht '5\' 4"'  (1.626 m)   Wt 71.7 kg   SpO2 93%   BMI 27.12 kg/m   Physical Exam Vitals and nursing note reviewed.  Constitutional:      General: She is not in acute distress.    Appearance: She is well-developed.  HENT:     Head: Normocephalic and atraumatic.     Comments: No temporal bulge nor tenderness    Nose: Nose normal.     Mouth/Throat:     Mouth: Mucous membranes are moist.     Pharynx: Oropharynx is clear.  Eyes:     Extraocular Movements: Extraocular movements intact.     Conjunctiva/sclera: Conjunctivae normal.     Pupils: Pupils are equal, round, and reactive to light.  Cardiovascular:     Rate and Rhythm: Regular rhythm.     Pulses: Normal pulses.     Heart sounds: Normal heart sounds.  Pulmonary:     Effort: Pulmonary effort is normal.     Breath sounds: Normal breath sounds.  Abdominal:     General: Abdomen is flat. Bowel sounds are normal.     Palpations: Abdomen is soft.     Tenderness: There is no abdominal tenderness. There is no guarding.  Musculoskeletal:        General: Normal range of motion.  Skin:    General: Skin is warm and dry.     Capillary Refill: Capillary refill takes less than 2 seconds.  Neurological:     General: No focal deficit present.     Mental Status: She is alert and oriented to person, place, and time.     Cranial Nerves: No  cranial nerve deficit.     Sensory: No sensory  deficit.     Motor: No weakness.     Gait: Gait normal.     Deep Tendon Reflexes: Reflexes normal.  Psychiatric:        Mood and Affect: Mood normal.        Behavior: Behavior normal.        Thought Content: Thought content normal.    ED Results / Procedures / Treatments   Labs (all labs ordered are listed, but only abnormal results are displayed) Results for orders placed or performed during the hospital encounter of 12/10/20  Resp Panel by RT-PCR (Flu A&B, Covid) Nasopharyngeal Swab   Specimen: Nasopharyngeal Swab; Nasopharyngeal(NP) swabs in vial transport medium  Result Value Ref Range   SARS Coronavirus 2 by RT PCR NEGATIVE NEGATIVE   Influenza A by PCR NEGATIVE NEGATIVE   Influenza B by PCR NEGATIVE NEGATIVE  Basic metabolic panel  Result Value Ref Range   Sodium 137 135 - 145 mmol/L   Potassium 3.7 3.5 - 5.1 mmol/L   Chloride 107 98 - 111 mmol/L   CO2 25 22 - 32 mmol/L   Glucose, Bld 96 70 - 99 mg/dL   BUN 17 8 - 23 mg/dL   Creatinine, Ser 0.77 0.44 - 1.00 mg/dL   Calcium 9.2 8.9 - 10.3 mg/dL   GFR, Estimated >60 >60 mL/min   Anion gap 5 5 - 15  CBC  Result Value Ref Range   WBC 6.5 4.0 - 10.5 K/uL   RBC 4.83 3.87 - 5.11 MIL/uL   Hemoglobin 14.1 12.0 - 15.0 g/dL   HCT 42.8 36.0 - 46.0 %   MCV 88.6 80.0 - 100.0 fL   MCH 29.2 26.0 - 34.0 pg   MCHC 32.9 30.0 - 36.0 g/dL   RDW 13.3 11.5 - 15.5 %   Platelets 289 150 - 400 K/uL   nRBC 0.0 0.0 - 0.2 %  Troponin I (High Sensitivity)  Result Value Ref Range   Troponin I (High Sensitivity) 2 <18 ng/L  Troponin I (High Sensitivity)  Result Value Ref Range   Troponin I (High Sensitivity) 2 <18 ng/L   CT Angio Head W or Wo Contrast  Result Date: 12/11/2020 CLINICAL DATA:  Facial trauma. Jaw and shoulder pain. Dizziness with loss of balance. EXAM: CT ANGIOGRAPHY HEAD AND NECK TECHNIQUE: Multidetector CT imaging of the head and neck was performed using the standard protocol  during bolus administration of intravenous contrast. Multiplanar CT image reconstructions and MIPs were obtained to evaluate the vascular anatomy. Carotid stenosis measurements (when applicable) are obtained utilizing NASCET criteria, using the distal internal carotid diameter as the denominator. CONTRAST:  61m OMNIPAQUE IOHEXOL 350 MG/ML SOLN COMPARISON:  None. FINDINGS: CT HEAD FINDINGS Brain: There is no mass, hemorrhage or extra-axial collection. The size and configuration of the ventricles and extra-axial CSF spaces are normal. There is no acute or chronic infarction. The brain parenchyma is normal. Skull: The visualized skull base, calvarium and extracranial soft tissues are normal. Sinuses/Orbits: No fluid levels or advanced mucosal thickening of the visualized paranasal sinuses. No mastoid or middle ear effusion. The orbits are normal. CTA NECK FINDINGS SKELETON: There is no bony spinal canal stenosis. No lytic or blastic lesion. OTHER NECK: Normal pharynx, larynx and major salivary glands. No cervical lymphadenopathy. Unremarkable thyroid gland. UPPER CHEST: No pneumothorax or pleural effusion. No nodules or masses. AORTIC ARCH: There is no calcific atherosclerosis of the aortic arch. There is no aneurysm, dissection or hemodynamically significant stenosis of the visualized portion of  the aorta. Conventional 3 vessel aortic branching pattern. The visualized proximal subclavian arteries are widely patent. RIGHT CAROTID SYSTEM: Normal without aneurysm, dissection or stenosis. LEFT CAROTID SYSTEM: Normal without aneurysm, dissection or stenosis. VERTEBRAL ARTERIES: Left dominant configuration. Both origins are clearly patent. There is no dissection, occlusion or flow-limiting stenosis to the skull base (V1-V3 segments). CTA HEAD FINDINGS POSTERIOR CIRCULATION: --Vertebral arteries: Normal V4 segments. --Inferior cerebellar arteries: Normal. --Basilar artery: Normal. --Superior cerebellar arteries: Normal.  --Posterior cerebral arteries (PCA): Normal. ANTERIOR CIRCULATION: --Intracranial internal carotid arteries: Normal. --Anterior cerebral arteries (ACA): Normal. Both A1 segments are present. Patent anterior communicating artery (a-comm). --Middle cerebral arteries (MCA): Normal. VENOUS SINUSES: As permitted by contrast timing, patent. ANATOMIC VARIANTS: None Review of the MIP images confirms the above findings. IMPRESSION: Normal CTA of the head and neck. Electronically Signed   By: Ulyses Jarred M.D.   On: 12/11/2020 03:50   DG Chest 2 View  Result Date: 12/10/2020 CLINICAL DATA:  Chest pain, nausea, vomiting, and weakness for 3 days. EXAM: CHEST - 2 VIEW COMPARISON:  02/19/2019 FINDINGS: Heart size and pulmonary vascularity are normal. Slight fibrosis in the lung bases. No airspace disease or consolidation in the lungs. No pleural effusions. No pneumothorax. Mediastinal contours appear intact. Postoperative changes in the cervical spine and lumbar spine. IMPRESSION: No active cardiopulmonary disease. Electronically Signed   By: Lucienne Capers M.D.   On: 12/10/2020 22:54   CT Angio Neck W and/or Wo Contrast  Result Date: 12/11/2020 CLINICAL DATA:  Facial trauma. Jaw and shoulder pain. Dizziness with loss of balance. EXAM: CT ANGIOGRAPHY HEAD AND NECK TECHNIQUE: Multidetector CT imaging of the head and neck was performed using the standard protocol during bolus administration of intravenous contrast. Multiplanar CT image reconstructions and MIPs were obtained to evaluate the vascular anatomy. Carotid stenosis measurements (when applicable) are obtained utilizing NASCET criteria, using the distal internal carotid diameter as the denominator. CONTRAST:  8m OMNIPAQUE IOHEXOL 350 MG/ML SOLN COMPARISON:  None. FINDINGS: CT HEAD FINDINGS Brain: There is no mass, hemorrhage or extra-axial collection. The size and configuration of the ventricles and extra-axial CSF spaces are normal. There is no acute or  chronic infarction. The brain parenchyma is normal. Skull: The visualized skull base, calvarium and extracranial soft tissues are normal. Sinuses/Orbits: No fluid levels or advanced mucosal thickening of the visualized paranasal sinuses. No mastoid or middle ear effusion. The orbits are normal. CTA NECK FINDINGS SKELETON: There is no bony spinal canal stenosis. No lytic or blastic lesion. OTHER NECK: Normal pharynx, larynx and major salivary glands. No cervical lymphadenopathy. Unremarkable thyroid gland. UPPER CHEST: No pneumothorax or pleural effusion. No nodules or masses. AORTIC ARCH: There is no calcific atherosclerosis of the aortic arch. There is no aneurysm, dissection or hemodynamically significant stenosis of the visualized portion of the aorta. Conventional 3 vessel aortic branching pattern. The visualized proximal subclavian arteries are widely patent. RIGHT CAROTID SYSTEM: Normal without aneurysm, dissection or stenosis. LEFT CAROTID SYSTEM: Normal without aneurysm, dissection or stenosis. VERTEBRAL ARTERIES: Left dominant configuration. Both origins are clearly patent. There is no dissection, occlusion or flow-limiting stenosis to the skull base (V1-V3 segments). CTA HEAD FINDINGS POSTERIOR CIRCULATION: --Vertebral arteries: Normal V4 segments. --Inferior cerebellar arteries: Normal. --Basilar artery: Normal. --Superior cerebellar arteries: Normal. --Posterior cerebral arteries (PCA): Normal. ANTERIOR CIRCULATION: --Intracranial internal carotid arteries: Normal. --Anterior cerebral arteries (ACA): Normal. Both A1 segments are present. Patent anterior communicating artery (a-comm). --Middle cerebral arteries (MCA): Normal. VENOUS SINUSES: As permitted by contrast timing, patent.  ANATOMIC VARIANTS: None Review of the MIP images confirms the above findings. IMPRESSION: Normal CTA of the head and neck. Electronically Signed   By: Ulyses Jarred M.D.   On: 12/11/2020 03:50    EKG EKG  Interpretation  Date/Time:  Friday December 11 2020 01:41:53 EDT Ventricular Rate:  79 PR Interval:  148 QRS Duration: 97 QT Interval:  375 QTC Calculation: 430 R Axis:   -25 Text Interpretation: Sinus rhythm Confirmed by Dory Horn) on 12/11/2020 2:52:13 AM  Radiology CT Angio Head W or Wo Contrast  Result Date: 12/11/2020 CLINICAL DATA:  Facial trauma. Jaw and shoulder pain. Dizziness with loss of balance. EXAM: CT ANGIOGRAPHY HEAD AND NECK TECHNIQUE: Multidetector CT imaging of the head and neck was performed using the standard protocol during bolus administration of intravenous contrast. Multiplanar CT image reconstructions and MIPs were obtained to evaluate the vascular anatomy. Carotid stenosis measurements (when applicable) are obtained utilizing NASCET criteria, using the distal internal carotid diameter as the denominator. CONTRAST:  61m OMNIPAQUE IOHEXOL 350 MG/ML SOLN COMPARISON:  None. FINDINGS: CT HEAD FINDINGS Brain: There is no mass, hemorrhage or extra-axial collection. The size and configuration of the ventricles and extra-axial CSF spaces are normal. There is no acute or chronic infarction. The brain parenchyma is normal. Skull: The visualized skull base, calvarium and extracranial soft tissues are normal. Sinuses/Orbits: No fluid levels or advanced mucosal thickening of the visualized paranasal sinuses. No mastoid or middle ear effusion. The orbits are normal. CTA NECK FINDINGS SKELETON: There is no bony spinal canal stenosis. No lytic or blastic lesion. OTHER NECK: Normal pharynx, larynx and major salivary glands. No cervical lymphadenopathy. Unremarkable thyroid gland. UPPER CHEST: No pneumothorax or pleural effusion. No nodules or masses. AORTIC ARCH: There is no calcific atherosclerosis of the aortic arch. There is no aneurysm, dissection or hemodynamically significant stenosis of the visualized portion of the aorta. Conventional 3 vessel aortic branching pattern. The  visualized proximal subclavian arteries are widely patent. RIGHT CAROTID SYSTEM: Normal without aneurysm, dissection or stenosis. LEFT CAROTID SYSTEM: Normal without aneurysm, dissection or stenosis. VERTEBRAL ARTERIES: Left dominant configuration. Both origins are clearly patent. There is no dissection, occlusion or flow-limiting stenosis to the skull base (V1-V3 segments). CTA HEAD FINDINGS POSTERIOR CIRCULATION: --Vertebral arteries: Normal V4 segments. --Inferior cerebellar arteries: Normal. --Basilar artery: Normal. --Superior cerebellar arteries: Normal. --Posterior cerebral arteries (PCA): Normal. ANTERIOR CIRCULATION: --Intracranial internal carotid arteries: Normal. --Anterior cerebral arteries (ACA): Normal. Both A1 segments are present. Patent anterior communicating artery (a-comm). --Middle cerebral arteries (MCA): Normal. VENOUS SINUSES: As permitted by contrast timing, patent. ANATOMIC VARIANTS: None Review of the MIP images confirms the above findings. IMPRESSION: Normal CTA of the head and neck. Electronically Signed   By: KUlyses JarredM.D.   On: 12/11/2020 03:50   DG Chest 2 View  Result Date: 12/10/2020 CLINICAL DATA:  Chest pain, nausea, vomiting, and weakness for 3 days. EXAM: CHEST - 2 VIEW COMPARISON:  02/19/2019 FINDINGS: Heart size and pulmonary vascularity are normal. Slight fibrosis in the lung bases. No airspace disease or consolidation in the lungs. No pleural effusions. No pneumothorax. Mediastinal contours appear intact. Postoperative changes in the cervical spine and lumbar spine. IMPRESSION: No active cardiopulmonary disease. Electronically Signed   By: WLucienne CapersM.D.   On: 12/10/2020 22:54   CT Angio Neck W and/or Wo Contrast  Result Date: 12/11/2020 CLINICAL DATA:  Facial trauma. Jaw and shoulder pain. Dizziness with loss of balance. EXAM: CT ANGIOGRAPHY HEAD AND NECK TECHNIQUE:  Multidetector CT imaging of the head and neck was performed using the standard protocol  during bolus administration of intravenous contrast. Multiplanar CT image reconstructions and MIPs were obtained to evaluate the vascular anatomy. Carotid stenosis measurements (when applicable) are obtained utilizing NASCET criteria, using the distal internal carotid diameter as the denominator. CONTRAST:  59m OMNIPAQUE IOHEXOL 350 MG/ML SOLN COMPARISON:  None. FINDINGS: CT HEAD FINDINGS Brain: There is no mass, hemorrhage or extra-axial collection. The size and configuration of the ventricles and extra-axial CSF spaces are normal. There is no acute or chronic infarction. The brain parenchyma is normal. Skull: The visualized skull base, calvarium and extracranial soft tissues are normal. Sinuses/Orbits: No fluid levels or advanced mucosal thickening of the visualized paranasal sinuses. No mastoid or middle ear effusion. The orbits are normal. CTA NECK FINDINGS SKELETON: There is no bony spinal canal stenosis. No lytic or blastic lesion. OTHER NECK: Normal pharynx, larynx and major salivary glands. No cervical lymphadenopathy. Unremarkable thyroid gland. UPPER CHEST: No pneumothorax or pleural effusion. No nodules or masses. AORTIC ARCH: There is no calcific atherosclerosis of the aortic arch. There is no aneurysm, dissection or hemodynamically significant stenosis of the visualized portion of the aorta. Conventional 3 vessel aortic branching pattern. The visualized proximal subclavian arteries are widely patent. RIGHT CAROTID SYSTEM: Normal without aneurysm, dissection or stenosis. LEFT CAROTID SYSTEM: Normal without aneurysm, dissection or stenosis. VERTEBRAL ARTERIES: Left dominant configuration. Both origins are clearly patent. There is no dissection, occlusion or flow-limiting stenosis to the skull base (V1-V3 segments). CTA HEAD FINDINGS POSTERIOR CIRCULATION: --Vertebral arteries: Normal V4 segments. --Inferior cerebellar arteries: Normal. --Basilar artery: Normal. --Superior cerebellar arteries: Normal.  --Posterior cerebral arteries (PCA): Normal. ANTERIOR CIRCULATION: --Intracranial internal carotid arteries: Normal. --Anterior cerebral arteries (ACA): Normal. Both A1 segments are present. Patent anterior communicating artery (a-comm). --Middle cerebral arteries (MCA): Normal. VENOUS SINUSES: As permitted by contrast timing, patent. ANATOMIC VARIANTS: None Review of the MIP images confirms the above findings. IMPRESSION: Normal CTA of the head and neck. Electronically Signed   By: KUlyses JarredM.D.   On: 12/11/2020 03:50    Procedures Procedures   Medications Ordered in ED Medications  sodium chloride (PF) 0.9 % injection (  Given 12/11/20 0401)  iohexol (OMNIPAQUE) 350 MG/ML injection 80 mL (80 mLs Intravenous Contrast Given 12/11/20 0309)  magnesium sulfate IVPB 2 g 50 mL (2 g Intravenous New Bag/Given 12/11/20 0356)  ketorolac (TORADOL) 30 MG/ML injection 30 mg (30 mg Intravenous Given 12/11/20 0400)  meclizine (ANTIVERT) tablet 12.5 mg (12.5 mg Oral Given 12/11/20 0410)    ED Course  I have reviewed the triage vital signs and the nursing notes.  Pertinent labs & imaging results that were available during my care of the patient were reviewed by me and considered in my medical decision making (see chart for details).    MDM Rules/Calculators/A&P                          Given migraine cocktail and meclizine.    434 case d/w Dr. KLorrin Goodellno indication for MRI, likely viral illness.  Treat symptomatically   Ruled out for MI and stroke in the ED. Heart score is 2 low risk for mace.   Patient has a myriad of symptoms ans this is either medication related or viral.    DKeatyn Luckwas evaluated in Emergency Department on 12/11/2020 for the symptoms described in the history of present illness. She was evaluated in  the context of the global COVID-19 pandemic, which necessitated consideration that the patient might be at risk for infection with the SARS-CoV-2 virus that causes  COVID-19. Institutional protocols and algorithms that pertain to the evaluation of patients at risk for COVID-19 are in a state of rapid change based on information released by regulatory bodies including the CDC and federal and state organizations. These policies and algorithms were followed during the patient's care in the ED.  Final Clinical Impression(s) / ED Diagnoses Final diagnoses:  None   Return for intractable cough, coughing up blood, fevers > 100.4 unrelieved by medication, shortness of breath, intractable vomiting, chest pain, shortness of breath, weakness, numbness, changes in speech, facial asymmetry, abdominal pain, passing out, Inability to tolerate liquids or food, cough, altered mental status or any concerns. No signs of systemic illness or infection. The patient is nontoxic-appearing on exam and vital signs are within normal limits.  I have reviewed the triage vital signs and the nursing notes. Pertinent labs & imaging results that were available during my care of the patient were reviewed by me and considered in my medical decision making (see chart for details). After history, exam, and medical workup I feel the patient has been appropriately medically screened and is safe for discharge home. Pertinent diagnoses were discussed with the patient. Patient was given return precautions.    Rx / DC Orders ED Discharge Orders     None           Siyon Linck, MD 12/11/20 [phone]

## 2020-12-11 NOTE — ED Notes (Signed)
Patient states her headache has improved and she is ready to go home. MD is aware.

## 2021-01-11 DIAGNOSIS — H2513 Age-related nuclear cataract, bilateral: Secondary | ICD-10-CM | POA: Diagnosis not present

## 2021-01-11 DIAGNOSIS — H2511 Age-related nuclear cataract, right eye: Secondary | ICD-10-CM | POA: Diagnosis not present

## 2021-01-12 DIAGNOSIS — H2512 Age-related nuclear cataract, left eye: Secondary | ICD-10-CM | POA: Diagnosis not present

## 2021-01-25 DIAGNOSIS — H2512 Age-related nuclear cataract, left eye: Secondary | ICD-10-CM | POA: Diagnosis not present

## 2021-01-25 DIAGNOSIS — H52202 Unspecified astigmatism, left eye: Secondary | ICD-10-CM | POA: Diagnosis not present

## 2021-02-08 DIAGNOSIS — M8580 Other specified disorders of bone density and structure, unspecified site: Secondary | ICD-10-CM | POA: Diagnosis not present

## 2021-02-08 DIAGNOSIS — M8589 Other specified disorders of bone density and structure, multiple sites: Secondary | ICD-10-CM | POA: Diagnosis not present

## 2021-02-08 DIAGNOSIS — F902 Attention-deficit hyperactivity disorder, combined type: Secondary | ICD-10-CM | POA: Diagnosis not present

## 2021-02-08 DIAGNOSIS — R030 Elevated blood-pressure reading, without diagnosis of hypertension: Secondary | ICD-10-CM | POA: Diagnosis not present

## 2021-02-08 DIAGNOSIS — Z780 Asymptomatic menopausal state: Secondary | ICD-10-CM | POA: Diagnosis not present

## 2021-02-12 ENCOUNTER — Other Ambulatory Visit: Payer: Self-pay | Admitting: Internal Medicine

## 2021-02-12 DIAGNOSIS — Z1231 Encounter for screening mammogram for malignant neoplasm of breast: Secondary | ICD-10-CM

## 2021-03-18 ENCOUNTER — Ambulatory Visit: Payer: PPO

## 2021-04-07 ENCOUNTER — Ambulatory Visit
Admission: RE | Admit: 2021-04-07 | Discharge: 2021-04-07 | Disposition: A | Discharge status: Home / Self Care | Payer: PPO | Source: Ambulatory Visit | Attending: Internal Medicine | Admitting: Internal Medicine

## 2021-04-07 ENCOUNTER — Other Ambulatory Visit: Payer: Self-pay

## 2021-04-07 DIAGNOSIS — Z1231 Encounter for screening mammogram for malignant neoplasm of breast: Secondary | ICD-10-CM

## 2021-05-10 DIAGNOSIS — Z01419 Encounter for gynecological examination (general) (routine) without abnormal findings: Secondary | ICD-10-CM | POA: Diagnosis not present

## 2021-05-10 DIAGNOSIS — F902 Attention-deficit hyperactivity disorder, combined type: Secondary | ICD-10-CM | POA: Diagnosis not present

## 2021-06-30 DIAGNOSIS — L282 Other prurigo: Secondary | ICD-10-CM | POA: Diagnosis not present

## 2021-06-30 DIAGNOSIS — B028 Zoster with other complications: Secondary | ICD-10-CM | POA: Diagnosis not present

## 2021-08-09 DIAGNOSIS — F902 Attention-deficit hyperactivity disorder, combined type: Secondary | ICD-10-CM | POA: Diagnosis not present

## 2021-08-09 DIAGNOSIS — Z1211 Encounter for screening for malignant neoplasm of colon: Secondary | ICD-10-CM | POA: Diagnosis not present

## 2021-12-02 DIAGNOSIS — E039 Hypothyroidism, unspecified: Secondary | ICD-10-CM | POA: Diagnosis not present

## 2021-12-02 DIAGNOSIS — R030 Elevated blood-pressure reading, without diagnosis of hypertension: Secondary | ICD-10-CM | POA: Diagnosis not present

## 2021-12-09 DIAGNOSIS — Z23 Encounter for immunization: Secondary | ICD-10-CM | POA: Diagnosis not present

## 2021-12-09 DIAGNOSIS — Z86010 Personal history of colonic polyps: Secondary | ICD-10-CM | POA: Diagnosis not present

## 2021-12-09 DIAGNOSIS — I700 Atherosclerosis of aorta: Secondary | ICD-10-CM | POA: Diagnosis not present

## 2021-12-09 DIAGNOSIS — E039 Hypothyroidism, unspecified: Secondary | ICD-10-CM | POA: Diagnosis not present

## 2021-12-09 DIAGNOSIS — M85852 Other specified disorders of bone density and structure, left thigh: Secondary | ICD-10-CM | POA: Diagnosis not present

## 2021-12-09 DIAGNOSIS — F988 Other specified behavioral and emotional disorders with onset usually occurring in childhood and adolescence: Secondary | ICD-10-CM | POA: Diagnosis not present

## 2021-12-09 DIAGNOSIS — L570 Actinic keratosis: Secondary | ICD-10-CM | POA: Diagnosis not present

## 2021-12-09 DIAGNOSIS — Z0000 Encounter for general adult medical examination without abnormal findings: Secondary | ICD-10-CM | POA: Diagnosis not present

## 2021-12-13 ENCOUNTER — Other Ambulatory Visit: Payer: Self-pay | Admitting: Cardiology

## 2021-12-13 DIAGNOSIS — I700 Atherosclerosis of aorta: Secondary | ICD-10-CM

## 2021-12-13 DIAGNOSIS — E785 Hyperlipidemia, unspecified: Secondary | ICD-10-CM

## 2022-01-01 ENCOUNTER — Other Ambulatory Visit: Payer: Self-pay | Admitting: Cardiology

## 2022-01-01 DIAGNOSIS — I700 Atherosclerosis of aorta: Secondary | ICD-10-CM

## 2022-01-01 DIAGNOSIS — E785 Hyperlipidemia, unspecified: Secondary | ICD-10-CM

## 2022-01-03 ENCOUNTER — Other Ambulatory Visit: Payer: Self-pay | Admitting: Cardiology

## 2022-01-03 DIAGNOSIS — I700 Atherosclerosis of aorta: Secondary | ICD-10-CM

## 2022-01-03 DIAGNOSIS — E785 Hyperlipidemia, unspecified: Secondary | ICD-10-CM

## 2022-01-07 ENCOUNTER — Other Ambulatory Visit: Payer: Self-pay

## 2022-01-07 DIAGNOSIS — I700 Atherosclerosis of aorta: Secondary | ICD-10-CM

## 2022-01-07 DIAGNOSIS — E785 Hyperlipidemia, unspecified: Secondary | ICD-10-CM

## 2022-01-07 MED ORDER — SIMVASTATIN 10 MG PO TABS: 10.0000 mg | ORAL_TABLET | Freq: Every day | ORAL | 1 refills | Status: AC

## 2022-03-25 DIAGNOSIS — R051 Acute cough: Secondary | ICD-10-CM | POA: Diagnosis not present

## 2022-03-25 DIAGNOSIS — R0989 Other specified symptoms and signs involving the circulatory and respiratory systems: Secondary | ICD-10-CM | POA: Diagnosis not present

## 2022-04-01 DIAGNOSIS — G8929 Other chronic pain: Secondary | ICD-10-CM | POA: Diagnosis not present

## 2022-04-01 DIAGNOSIS — M25511 Pain in right shoulder: Secondary | ICD-10-CM | POA: Diagnosis not present

## 2022-04-04 ENCOUNTER — Ambulatory Visit: Payer: PPO | Admitting: Family Medicine

## 2022-04-13 ENCOUNTER — Ambulatory Visit: Payer: PPO | Admitting: Family Medicine

## 2022-05-16 DIAGNOSIS — Z01419 Encounter for gynecological examination (general) (routine) without abnormal findings: Secondary | ICD-10-CM | POA: Diagnosis not present

## 2022-05-30 ENCOUNTER — Other Ambulatory Visit: Payer: Self-pay | Admitting: Internal Medicine

## 2022-05-30 DIAGNOSIS — Z1231 Encounter for screening mammogram for malignant neoplasm of breast: Secondary | ICD-10-CM

## 2022-06-01 ENCOUNTER — Ambulatory Visit
Admission: RE | Admit: 2022-06-01 | Discharge: 2022-06-01 | Disposition: A | Discharge status: Home / Self Care | Payer: PPO | Source: Ambulatory Visit | Attending: Internal Medicine | Admitting: Internal Medicine

## 2022-06-01 ENCOUNTER — Emergency Department (HOSPITAL_BASED_OUTPATIENT_CLINIC_OR_DEPARTMENT_OTHER): Payer: PPO

## 2022-06-01 ENCOUNTER — Emergency Department (HOSPITAL_BASED_OUTPATIENT_CLINIC_OR_DEPARTMENT_OTHER)
Admission: EM | Admit: 2022-06-01 | Discharge: 2022-06-01 | Disposition: A | Discharge status: Home / Self Care | Payer: PPO | Attending: Emergency Medicine | Admitting: Emergency Medicine

## 2022-06-01 ENCOUNTER — Other Ambulatory Visit: Payer: Self-pay

## 2022-06-01 ENCOUNTER — Encounter (HOSPITAL_BASED_OUTPATIENT_CLINIC_OR_DEPARTMENT_OTHER): Payer: Self-pay | Admitting: Emergency Medicine

## 2022-06-01 DIAGNOSIS — R1030 Lower abdominal pain, unspecified: Secondary | ICD-10-CM

## 2022-06-01 DIAGNOSIS — R109 Unspecified abdominal pain: Secondary | ICD-10-CM | POA: Diagnosis not present

## 2022-06-01 DIAGNOSIS — Z1231 Encounter for screening mammogram for malignant neoplasm of breast: Secondary | ICD-10-CM

## 2022-06-01 DIAGNOSIS — I10 Essential (primary) hypertension: Secondary | ICD-10-CM | POA: Diagnosis not present

## 2022-06-01 DIAGNOSIS — K529 Noninfective gastroenteritis and colitis, unspecified: Secondary | ICD-10-CM | POA: Diagnosis not present

## 2022-06-01 DIAGNOSIS — R1110 Vomiting, unspecified: Secondary | ICD-10-CM | POA: Diagnosis not present

## 2022-06-01 DIAGNOSIS — I700 Atherosclerosis of aorta: Secondary | ICD-10-CM | POA: Diagnosis not present

## 2022-06-01 LAB — CBC
HCT: 41.6 % (ref 36.0–46.0)
Hemoglobin: 13.9 g/dL (ref 12.0–15.0)
MCH: 28.9 pg (ref 26.0–34.0)
MCHC: 33.4 g/dL (ref 30.0–36.0)
MCV: 86.5 fL (ref 80.0–100.0)
Platelets: 285 10*3/uL (ref 150–400)
RBC: 4.81 MIL/uL (ref 3.87–5.11)
RDW: 13.3 % (ref 11.5–15.5)
WBC: 7 10*3/uL (ref 4.0–10.5)
nRBC: 0 % (ref 0.0–0.2)

## 2022-06-01 LAB — URINALYSIS, ROUTINE W REFLEX MICROSCOPIC
Bilirubin Urine: NEGATIVE
Glucose, UA: NEGATIVE mg/dL
Hgb urine dipstick: NEGATIVE
Ketones, ur: NEGATIVE mg/dL
Leukocytes,Ua: NEGATIVE
Nitrite: NEGATIVE
Protein, ur: NEGATIVE mg/dL
Specific Gravity, Urine: 1.017 (ref 1.005–1.030)
pH: 7 (ref 5.0–8.0)

## 2022-06-01 LAB — COMPREHENSIVE METABOLIC PANEL
ALT: 22 U/L (ref 0–44)
AST: 19 U/L (ref 15–41)
Albumin: 4.4 g/dL (ref 3.5–5.0)
Alkaline Phosphatase: 46 U/L (ref 38–126)
Anion gap: 6 (ref 5–15)
BUN: 14 mg/dL (ref 8–23)
CO2: 29 mmol/L (ref 22–32)
Calcium: 10 mg/dL (ref 8.9–10.3)
Chloride: 103 mmol/L (ref 98–111)
Creatinine, Ser: 0.69 mg/dL (ref 0.44–1.00)
GFR, Estimated: >60 mL/min (ref >60)
Glucose, Bld: 107 mg/dL — ABNORMAL HIGH (ref 70–99)
Potassium: 4.5 mmol/L (ref 3.5–5.1)
Sodium: 138 mmol/L (ref 135–145)
Total Bilirubin: 0.7 mg/dL (ref 0.3–1.2)
Total Protein: 7.5 g/dL (ref 6.5–8.1)

## 2022-06-01 LAB — LIPASE, BLOOD: Lipase: 19 U/L (ref 11–51)

## 2022-06-01 MED ORDER — ONDANSETRON HCL 4 MG/2ML IJ SOLN
4.0000 mg | Freq: Once | INTRAMUSCULAR | Status: AC
  Administered 2022-06-01: 4 mg via INTRAVENOUS
  Filled 2022-06-01: qty 2

## 2022-06-01 MED ORDER — SODIUM CHLORIDE 0.9 % IV BOLUS
1000.0000 mL | Freq: Once | INTRAVENOUS | Status: AC
  Administered 2022-06-01: 1000 mL via INTRAVENOUS

## 2022-06-01 MED ORDER — SODIUM CHLORIDE 0.9 % IV SOLN: INTRAVENOUS | Status: DC

## 2022-06-01 MED ORDER — IOHEXOL 300 MG/ML  SOLN
100.0000 mL | Freq: Once | INTRAMUSCULAR | Status: AC | PRN
  Administered 2022-06-01: 75 mL via INTRAVENOUS

## 2022-06-01 NOTE — ED Triage Notes (Signed)
Pt arrived POV, from work c/o abdominal pain, N/V/D that started yesterday. Pain 4/10. Hx diverticulitis in the past.  A/ox4

## 2022-06-01 NOTE — Discharge Instructions (Signed)
Take the Zofran that she had at home as needed for nausea and vomiting.  Also can use Imodium A-D over-the-counter for diarrhea.  CT scan without any acute findings labs without any significant abnormalities which is very reassuring.  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  Return for any new or worse symptoms.

## 2022-06-01 NOTE — ED Provider Notes (Signed)
[facility] Provider Note   CSN: [id number] Arrival date & time: 06/01/22  1034     History  Chief Complaint  Patient presents with   Abdominal Pain    Sherry Pierce is a 67 y.o. female.  Patient with the acute onset of lower abdominal pain nausea vomiting and diarrhea.  Had some mucousy blood with the diarrhea.  Pain did radiate to the low back as well.  Onset was yesterday.  Patient works overnight.  Has a history of diverticulitis in the past.  Past medical history significant for thyroid disease diverticulitis and hypertension.  Past surgical history significant for vaginal hysterectomy.  Patient is a former smoker quit 1982.  Patient works as a Marine scientist at [employer].  Toilet water is never turned red with the bowel movements.  Patient is not on blood thinners.       Home Medications Prior to Admission medications   Medication Sig Start Date End Date Taking? Authorizing Provider  acetaminophen (TYLENOL) 500 MG tablet Take 500-1,000 mg by mouth every 6 (six) hours as needed (for pain.).    [provider]  amphetamine-dextroamphetamine (ADDERALL XR) 30 MG 24 hr capsule Take 30 mg by mouth in the morning. 04/15/20   [provider]  Cholecalciferol 50 MCG (2000 UT) CAPS Take 2,000 Units by mouth daily.    [provider]  ibuprofen (ADVIL) 800 MG tablet Take 800 mg by mouth every 8 (eight) hours as needed (pain.).    [provider]  levothyroxine (SYNTHROID, LEVOTHROID) 100 MCG tablet Take 100 mcg by mouth daily before breakfast.    [provider]  simvastatin (ZOCOR) 10 MG tablet Take 1 tablet (10 mg total) by mouth at bedtime. 01/07/22 01/02/23  Adrian Prows, MD      Allergies    Doxycycline, Sulfa antibiotics, Codeine, Ciprofloxacin, Clarithromycin, Erythrocin, and Metronidazole    Review of Systems   Review of Systems  Constitutional:  Negative for chills and fever.  HENT:  Negative  for ear pain and sore throat.   Eyes:  Negative for pain and visual disturbance.  Respiratory:  Negative for cough and shortness of breath.   Cardiovascular:  Negative for chest pain and palpitations.  Gastrointestinal:  Positive for abdominal pain, blood in stool, diarrhea, nausea and vomiting.  Genitourinary:  Negative for dysuria and hematuria.  Musculoskeletal:  Negative for arthralgias and back pain.  Skin:  Negative for color change and rash.  Neurological:  Negative for seizures and syncope.  All other systems reviewed and are negative.   Physical Exam Updated Vital Signs BP (!) 166/100 (BP Location: Left Arm)   Pulse 92   Temp 98.4 F (36.9 C) (Oral)   Resp 17   Ht 1.626 m (5\' 4" )   Wt 69.4 kg   SpO2 94%   BMI 26.26 kg/m  Physical Exam Vitals and nursing note reviewed.  Constitutional:      General: She is not in acute distress.    Appearance: Normal appearance. She is well-developed.  HENT:     Head: Normocephalic and atraumatic.     Mouth/Throat:     Mouth: Mucous membranes are dry.  Eyes:     Extraocular Movements: Extraocular movements intact.     Conjunctiva/sclera: Conjunctivae normal.     Pupils: Pupils are equal, round, and reactive to light.  Cardiovascular:     Rate and Rhythm: Normal rate and regular rhythm.     Heart sounds: No murmur heard.  Pulmonary:     Effort: Pulmonary effort is normal. No respiratory distress.     Breath sounds: Normal breath sounds.  Abdominal:     Palpations: Abdomen is soft.     Tenderness: There is no abdominal tenderness. There is no guarding.  Musculoskeletal:        General: No swelling.     Cervical back: Neck supple.  Skin:    General: Skin is warm and dry.     Capillary Refill: Capillary refill takes less than 2 seconds.  Neurological:     General: No focal deficit present.     Mental Status: She is alert and oriented to person, place, and time.     Cranial Nerves: No cranial nerve deficit.     Sensory: No  sensory deficit.     Motor: No weakness.  Psychiatric:        Mood and Affect: Mood normal.     ED Results / Procedures / Treatments   Labs (all labs ordered are listed, but only abnormal results are displayed) Labs Reviewed  COMPREHENSIVE METABOLIC PANEL - Abnormal; Notable for the following components:      Result Value   Glucose, Bld 107 (*)    All other components within normal limits  LIPASE, BLOOD  CBC  URINALYSIS, ROUTINE W REFLEX MICROSCOPIC    EKG None  Radiology CT Abdomen Pelvis W Contrast  Result Date: 06/01/2022 CLINICAL DATA:  Abdominal pain, nausea, vomiting, and diarrhea starting yesterday, history hypertension, diverticulitis EXAM: CT ABDOMEN AND PELVIS WITH CONTRAST TECHNIQUE: Multidetector CT imaging of the abdomen and pelvis was performed using the standard protocol following bolus administration of intravenous contrast. RADIATION DOSE REDUCTION: This exam was performed according to the departmental dose-optimization program which includes automated exposure control, adjustment of the mA and/or kV according to patient size and/or use of iterative reconstruction technique. CONTRAST:  70mL OMNIPAQUE IOHEXOL 300 MG/ML SOLN IV. No oral contrast. COMPARISON:  02/15/2011 FINDINGS: Lower chest: Dependent atelectasis RIGHT lower lobe. Hepatobiliary: Calcified gallstone within gallbladder. Liver unremarkable. No biliary dilatation. Pancreas: Normal appearance Spleen: Normal appearance Adrenals/Urinary Tract: Adrenal glands, kidneys, ureters, and bladder normal appearance Stomach/Bowel: Normal appendix. Mild sigmoid diverticulosis without evidence of diverticulitis. Stomach and bowel loops otherwise normal appearance. Vascular/Lymphatic: Atherosclerotic calcifications aorta without aneurysm. No adenopathy. Multiple pelvic phleboliths. Reproductive: Uterus surgically absent.  Unremarkable ovaries. Other: No free air or free fluid. No hernia or inflammatory process. Musculoskeletal:  Prior lumbar fusion L3-S1. Degenerative disc disease changes lumbar spine. No acute osseous findings. IMPRESSION: Cholelithiasis. Mild sigmoid diverticulosis without evidence of diverticulitis. No acute intra-abdominal or intrapelvic abnormalities. Aortic Atherosclerosis (ICD10-I70.0). Electronically Signed   By: Lavonia Dana M.D.   On: 06/01/2022 12:58    Procedures Procedures    Medications Ordered in ED Medications  0.9 %  sodium chloride infusion ( Intravenous New Bag/Given 06/01/22 1256)  ondansetron (ZOFRAN) injection 4 mg (4 mg Intravenous Given 06/01/22 1232)  sodium chloride 0.9 % bolus 1,000 mL (1,000 mLs Intravenous New Bag/Given 06/01/22 1235)  iohexol (OMNIPAQUE) 300 MG/ML solution 100 mL (75 mLs Intravenous Contrast Given 06/01/22 1241)    ED Course/ Medical Decision Making/ A&P                             Medical Decision Making Amount and/or Complexity of Data Reviewed Labs: ordered. Radiology: ordered.  Risk Prescription drug management.   Patient with acute onset of lower quadrant abdominal pain has a history  of diverticulitis.  Clinically sounds like a gastroenteritis viral in nature.  Will get CT scan abdomen and pelvis to rule out any acute process.  Labs lipase normal complete metabolic panel normal including renal function.  CBC no leukocytosis hemoglobin 13.9.  Platelets normal at 285.  Urinalysis negative for urinary tract infection.  CT showed cholelithiasis but no abnormalities there and patient's pain has been lower quadrant.  Mild sigmoid diverticulosis no diverticulitis.  No acute intra-abdominal or intrapelvic abnormalities.  Patient improved here patient has Zofran at home also can take Imodium A-D over-the-counter.  Thinks that this is a viral gastroenteritis.  Patient will be given work note.  Patient will return for any new or worse symptoms.  Patient will return for increased blood in the bowel movements.   Final Clinical Impression(s) / ED  Diagnoses Final diagnoses:  Gastroenteritis  Lower abdominal pain    Rx / DC Orders ED Discharge Orders     None         Fredia Sorrow, MD 06/01/22 1329

## 2022-06-01 NOTE — ED Notes (Signed)
Pt verbalized understanding of d/c instructions, meds, and followup care. Home supply zofran. Work note given. Denies questions. VSS, no distress noted. Steady gait to exit with all belongings.

## 2022-06-02 DIAGNOSIS — R1013 Epigastric pain: Secondary | ICD-10-CM | POA: Diagnosis not present

## 2022-06-02 DIAGNOSIS — K8020 Calculus of gallbladder without cholecystitis without obstruction: Secondary | ICD-10-CM | POA: Diagnosis not present

## 2022-06-02 DIAGNOSIS — K529 Noninfective gastroenteritis and colitis, unspecified: Secondary | ICD-10-CM | POA: Diagnosis not present

## 2022-06-02 DIAGNOSIS — L309 Dermatitis, unspecified: Secondary | ICD-10-CM | POA: Diagnosis not present

## 2022-06-06 ENCOUNTER — Telehealth: Payer: Self-pay

## 2022-06-06 NOTE — Telephone Encounter (Signed)
     Patient  visit on [facility]  4/3 Have you been able to follow up with your primary care physician? Yes   The patient was or was not able to obtain any needed medicine or equipment. Yes   Are there diet recommendations that you are having difficulty following? Na   Patient expresses understanding of discharge instructions and education provided has no other needs at this time.  Yes      Lenard Forth [facility] Guide, [facility] [phone] [address] [place], [place], [place] [address] Phone: [phone] Email: [email or web address]

## 2022-07-11 DIAGNOSIS — R197 Diarrhea, unspecified: Secondary | ICD-10-CM | POA: Diagnosis not present

## 2022-07-11 DIAGNOSIS — K8020 Calculus of gallbladder without cholecystitis without obstruction: Secondary | ICD-10-CM | POA: Diagnosis not present

## 2022-07-11 DIAGNOSIS — R112 Nausea with vomiting, unspecified: Secondary | ICD-10-CM | POA: Diagnosis not present

## 2022-07-11 DIAGNOSIS — R079 Chest pain, unspecified: Secondary | ICD-10-CM | POA: Diagnosis not present

## 2022-07-11 DIAGNOSIS — K224 Dyskinesia of esophagus: Secondary | ICD-10-CM | POA: Diagnosis not present

## 2022-08-08 DIAGNOSIS — M170 Bilateral primary osteoarthritis of knee: Secondary | ICD-10-CM | POA: Diagnosis not present

## 2022-08-08 DIAGNOSIS — M1712 Unilateral primary osteoarthritis, left knee: Secondary | ICD-10-CM | POA: Diagnosis not present

## 2022-08-08 DIAGNOSIS — M1711 Unilateral primary osteoarthritis, right knee: Secondary | ICD-10-CM | POA: Diagnosis not present

## 2022-08-16 DIAGNOSIS — K3189 Other diseases of stomach and duodenum: Secondary | ICD-10-CM | POA: Diagnosis not present

## 2022-08-16 DIAGNOSIS — D124 Benign neoplasm of descending colon: Secondary | ICD-10-CM | POA: Diagnosis not present

## 2022-08-16 DIAGNOSIS — K219 Gastro-esophageal reflux disease without esophagitis: Secondary | ICD-10-CM | POA: Diagnosis not present

## 2022-08-16 DIAGNOSIS — K648 Other hemorrhoids: Secondary | ICD-10-CM | POA: Diagnosis not present

## 2022-08-16 DIAGNOSIS — K2100 Gastro-esophageal reflux disease with esophagitis, without bleeding: Secondary | ICD-10-CM | POA: Diagnosis not present

## 2022-08-16 DIAGNOSIS — K5730 Diverticulosis of large intestine without perforation or abscess without bleeding: Secondary | ICD-10-CM | POA: Diagnosis not present

## 2022-08-16 DIAGNOSIS — Z86010 Personal history of colonic polyps: Secondary | ICD-10-CM | POA: Diagnosis not present

## 2022-08-16 DIAGNOSIS — Z09 Encounter for follow-up examination after completed treatment for conditions other than malignant neoplasm: Secondary | ICD-10-CM | POA: Diagnosis not present

## 2022-08-16 DIAGNOSIS — K2930 Chronic superficial gastritis without bleeding: Secondary | ICD-10-CM | POA: Diagnosis not present

## 2022-08-23 DIAGNOSIS — K219 Gastro-esophageal reflux disease without esophagitis: Secondary | ICD-10-CM | POA: Diagnosis not present

## 2022-08-23 DIAGNOSIS — D124 Benign neoplasm of descending colon: Secondary | ICD-10-CM | POA: Diagnosis not present

## 2022-08-23 DIAGNOSIS — K2930 Chronic superficial gastritis without bleeding: Secondary | ICD-10-CM | POA: Diagnosis not present

## 2022-10-27 DIAGNOSIS — Z01818 Encounter for other preprocedural examination: Secondary | ICD-10-CM | POA: Diagnosis not present

## 2022-10-27 DIAGNOSIS — Z23 Encounter for immunization: Secondary | ICD-10-CM | POA: Diagnosis not present

## 2022-11-09 ENCOUNTER — Other Ambulatory Visit: Payer: Self-pay | Admitting: Cardiology

## 2022-11-09 DIAGNOSIS — I700 Atherosclerosis of aorta: Secondary | ICD-10-CM

## 2022-11-09 DIAGNOSIS — E785 Hyperlipidemia, unspecified: Secondary | ICD-10-CM

## 2022-11-24 DIAGNOSIS — R262 Difficulty in walking, not elsewhere classified: Secondary | ICD-10-CM | POA: Diagnosis not present

## 2022-11-24 DIAGNOSIS — M25661 Stiffness of right knee, not elsewhere classified: Secondary | ICD-10-CM | POA: Diagnosis not present

## 2022-11-24 DIAGNOSIS — M1731 Unilateral post-traumatic osteoarthritis, right knee: Secondary | ICD-10-CM | POA: Diagnosis not present

## 2022-11-30 DIAGNOSIS — M1731 Unilateral post-traumatic osteoarthritis, right knee: Secondary | ICD-10-CM | POA: Diagnosis not present

## 2022-11-30 DIAGNOSIS — R262 Difficulty in walking, not elsewhere classified: Secondary | ICD-10-CM | POA: Diagnosis not present

## 2022-11-30 DIAGNOSIS — M25661 Stiffness of right knee, not elsewhere classified: Secondary | ICD-10-CM | POA: Diagnosis not present

## 2022-12-07 DIAGNOSIS — M25661 Stiffness of right knee, not elsewhere classified: Secondary | ICD-10-CM | POA: Diagnosis not present

## 2022-12-07 DIAGNOSIS — R262 Difficulty in walking, not elsewhere classified: Secondary | ICD-10-CM | POA: Diagnosis not present

## 2022-12-07 DIAGNOSIS — M1731 Unilateral post-traumatic osteoarthritis, right knee: Secondary | ICD-10-CM | POA: Diagnosis not present

## 2022-12-08 DIAGNOSIS — M1990 Unspecified osteoarthritis, unspecified site: Secondary | ICD-10-CM | POA: Diagnosis not present

## 2022-12-08 DIAGNOSIS — M899 Disorder of bone, unspecified: Secondary | ICD-10-CM | POA: Diagnosis not present

## 2022-12-08 DIAGNOSIS — I700 Atherosclerosis of aorta: Secondary | ICD-10-CM | POA: Diagnosis not present

## 2022-12-08 DIAGNOSIS — E039 Hypothyroidism, unspecified: Secondary | ICD-10-CM | POA: Diagnosis not present

## 2022-12-08 DIAGNOSIS — I10 Essential (primary) hypertension: Secondary | ICD-10-CM | POA: Diagnosis not present

## 2022-12-13 DIAGNOSIS — I700 Atherosclerosis of aorta: Secondary | ICD-10-CM | POA: Diagnosis not present

## 2022-12-13 DIAGNOSIS — M8589 Other specified disorders of bone density and structure, multiple sites: Secondary | ICD-10-CM | POA: Diagnosis not present

## 2022-12-13 DIAGNOSIS — F988 Other specified behavioral and emotional disorders with onset usually occurring in childhood and adolescence: Secondary | ICD-10-CM | POA: Diagnosis not present

## 2022-12-13 DIAGNOSIS — I10 Essential (primary) hypertension: Secondary | ICD-10-CM | POA: Diagnosis not present

## 2022-12-13 DIAGNOSIS — Z0000 Encounter for general adult medical examination without abnormal findings: Secondary | ICD-10-CM | POA: Diagnosis not present

## 2022-12-13 DIAGNOSIS — E039 Hypothyroidism, unspecified: Secondary | ICD-10-CM | POA: Diagnosis not present

## 2022-12-13 DIAGNOSIS — L570 Actinic keratosis: Secondary | ICD-10-CM | POA: Diagnosis not present

## 2022-12-13 DIAGNOSIS — K8020 Calculus of gallbladder without cholecystitis without obstruction: Secondary | ICD-10-CM | POA: Diagnosis not present

## 2022-12-15 DIAGNOSIS — M1731 Unilateral post-traumatic osteoarthritis, right knee: Secondary | ICD-10-CM | POA: Diagnosis not present

## 2022-12-15 DIAGNOSIS — R262 Difficulty in walking, not elsewhere classified: Secondary | ICD-10-CM | POA: Diagnosis not present

## 2022-12-15 DIAGNOSIS — M25661 Stiffness of right knee, not elsewhere classified: Secondary | ICD-10-CM | POA: Diagnosis not present

## 2022-12-22 DIAGNOSIS — G8918 Other acute postprocedural pain: Secondary | ICD-10-CM | POA: Diagnosis not present

## 2022-12-22 DIAGNOSIS — Z96651 Presence of right artificial knee joint: Secondary | ICD-10-CM | POA: Diagnosis not present

## 2022-12-22 DIAGNOSIS — M1711 Unilateral primary osteoarthritis, right knee: Secondary | ICD-10-CM | POA: Diagnosis not present

## 2022-12-26 DIAGNOSIS — M25661 Stiffness of right knee, not elsewhere classified: Secondary | ICD-10-CM | POA: Diagnosis not present

## 2022-12-26 DIAGNOSIS — Z96651 Presence of right artificial knee joint: Secondary | ICD-10-CM | POA: Diagnosis not present

## 2022-12-26 DIAGNOSIS — M6281 Muscle weakness (generalized): Secondary | ICD-10-CM | POA: Diagnosis not present

## 2022-12-29 DIAGNOSIS — M6281 Muscle weakness (generalized): Secondary | ICD-10-CM | POA: Diagnosis not present

## 2022-12-29 DIAGNOSIS — Z96651 Presence of right artificial knee joint: Secondary | ICD-10-CM | POA: Diagnosis not present

## 2022-12-29 DIAGNOSIS — M25661 Stiffness of right knee, not elsewhere classified: Secondary | ICD-10-CM | POA: Diagnosis not present

## 2023-01-02 DIAGNOSIS — Z96651 Presence of right artificial knee joint: Secondary | ICD-10-CM | POA: Diagnosis not present

## 2023-01-02 DIAGNOSIS — M6281 Muscle weakness (generalized): Secondary | ICD-10-CM | POA: Diagnosis not present

## 2023-01-02 DIAGNOSIS — M25661 Stiffness of right knee, not elsewhere classified: Secondary | ICD-10-CM | POA: Diagnosis not present

## 2023-01-05 DIAGNOSIS — M6281 Muscle weakness (generalized): Secondary | ICD-10-CM | POA: Diagnosis not present

## 2023-01-05 DIAGNOSIS — Z96651 Presence of right artificial knee joint: Secondary | ICD-10-CM | POA: Diagnosis not present

## 2023-01-05 DIAGNOSIS — M25661 Stiffness of right knee, not elsewhere classified: Secondary | ICD-10-CM | POA: Diagnosis not present

## 2023-01-09 DIAGNOSIS — M25661 Stiffness of right knee, not elsewhere classified: Secondary | ICD-10-CM | POA: Diagnosis not present

## 2023-01-09 DIAGNOSIS — Z96651 Presence of right artificial knee joint: Secondary | ICD-10-CM | POA: Diagnosis not present

## 2023-01-09 DIAGNOSIS — M6281 Muscle weakness (generalized): Secondary | ICD-10-CM | POA: Diagnosis not present

## 2023-01-12 DIAGNOSIS — M25661 Stiffness of right knee, not elsewhere classified: Secondary | ICD-10-CM | POA: Diagnosis not present

## 2023-01-12 DIAGNOSIS — M6281 Muscle weakness (generalized): Secondary | ICD-10-CM | POA: Diagnosis not present

## 2023-01-12 DIAGNOSIS — Z96651 Presence of right artificial knee joint: Secondary | ICD-10-CM | POA: Diagnosis not present

## 2023-01-16 DIAGNOSIS — M25661 Stiffness of right knee, not elsewhere classified: Secondary | ICD-10-CM | POA: Diagnosis not present

## 2023-01-16 DIAGNOSIS — M6281 Muscle weakness (generalized): Secondary | ICD-10-CM | POA: Diagnosis not present

## 2023-01-16 DIAGNOSIS — Z96651 Presence of right artificial knee joint: Secondary | ICD-10-CM | POA: Diagnosis not present

## 2023-01-18 DIAGNOSIS — M25661 Stiffness of right knee, not elsewhere classified: Secondary | ICD-10-CM | POA: Diagnosis not present

## 2023-01-18 DIAGNOSIS — M6281 Muscle weakness (generalized): Secondary | ICD-10-CM | POA: Diagnosis not present

## 2023-01-18 DIAGNOSIS — Z96651 Presence of right artificial knee joint: Secondary | ICD-10-CM | POA: Diagnosis not present

## 2023-01-20 DIAGNOSIS — M25661 Stiffness of right knee, not elsewhere classified: Secondary | ICD-10-CM | POA: Diagnosis not present

## 2023-01-20 DIAGNOSIS — Z96651 Presence of right artificial knee joint: Secondary | ICD-10-CM | POA: Diagnosis not present

## 2023-01-20 DIAGNOSIS — M6281 Muscle weakness (generalized): Secondary | ICD-10-CM | POA: Diagnosis not present

## 2023-02-02 DIAGNOSIS — M24661 Ankylosis, right knee: Secondary | ICD-10-CM | POA: Diagnosis not present

## 2023-02-02 DIAGNOSIS — Z96651 Presence of right artificial knee joint: Secondary | ICD-10-CM | POA: Diagnosis not present

## 2023-02-03 DIAGNOSIS — Z96651 Presence of right artificial knee joint: Secondary | ICD-10-CM | POA: Diagnosis not present

## 2023-02-03 DIAGNOSIS — M25661 Stiffness of right knee, not elsewhere classified: Secondary | ICD-10-CM | POA: Diagnosis not present

## 2023-02-03 DIAGNOSIS — M6281 Muscle weakness (generalized): Secondary | ICD-10-CM | POA: Diagnosis not present

## 2023-02-06 DIAGNOSIS — M25661 Stiffness of right knee, not elsewhere classified: Secondary | ICD-10-CM | POA: Diagnosis not present

## 2023-02-06 DIAGNOSIS — Z96651 Presence of right artificial knee joint: Secondary | ICD-10-CM | POA: Diagnosis not present

## 2023-02-06 DIAGNOSIS — M6281 Muscle weakness (generalized): Secondary | ICD-10-CM | POA: Diagnosis not present

## 2023-02-08 DIAGNOSIS — M25661 Stiffness of right knee, not elsewhere classified: Secondary | ICD-10-CM | POA: Diagnosis not present

## 2023-02-08 DIAGNOSIS — M6281 Muscle weakness (generalized): Secondary | ICD-10-CM | POA: Diagnosis not present

## 2023-02-08 DIAGNOSIS — Z96651 Presence of right artificial knee joint: Secondary | ICD-10-CM | POA: Diagnosis not present

## 2023-02-09 DIAGNOSIS — Z96651 Presence of right artificial knee joint: Secondary | ICD-10-CM | POA: Diagnosis not present

## 2023-02-09 DIAGNOSIS — M6281 Muscle weakness (generalized): Secondary | ICD-10-CM | POA: Diagnosis not present

## 2023-02-09 DIAGNOSIS — M25661 Stiffness of right knee, not elsewhere classified: Secondary | ICD-10-CM | POA: Diagnosis not present

## 2023-02-13 DIAGNOSIS — M6281 Muscle weakness (generalized): Secondary | ICD-10-CM | POA: Diagnosis not present

## 2023-02-13 DIAGNOSIS — Z96651 Presence of right artificial knee joint: Secondary | ICD-10-CM | POA: Diagnosis not present

## 2023-02-13 DIAGNOSIS — M25661 Stiffness of right knee, not elsewhere classified: Secondary | ICD-10-CM | POA: Diagnosis not present

## 2023-02-15 DIAGNOSIS — M25661 Stiffness of right knee, not elsewhere classified: Secondary | ICD-10-CM | POA: Diagnosis not present

## 2023-02-15 DIAGNOSIS — M6281 Muscle weakness (generalized): Secondary | ICD-10-CM | POA: Diagnosis not present

## 2023-02-15 DIAGNOSIS — Z96651 Presence of right artificial knee joint: Secondary | ICD-10-CM | POA: Diagnosis not present

## 2023-02-17 DIAGNOSIS — M25661 Stiffness of right knee, not elsewhere classified: Secondary | ICD-10-CM | POA: Diagnosis not present

## 2023-02-17 DIAGNOSIS — M6281 Muscle weakness (generalized): Secondary | ICD-10-CM | POA: Diagnosis not present

## 2023-02-17 DIAGNOSIS — Z96651 Presence of right artificial knee joint: Secondary | ICD-10-CM | POA: Diagnosis not present

## 2023-02-24 DIAGNOSIS — M25661 Stiffness of right knee, not elsewhere classified: Secondary | ICD-10-CM | POA: Diagnosis not present

## 2023-02-24 DIAGNOSIS — Z96651 Presence of right artificial knee joint: Secondary | ICD-10-CM | POA: Diagnosis not present

## 2023-02-24 DIAGNOSIS — M6281 Muscle weakness (generalized): Secondary | ICD-10-CM | POA: Diagnosis not present

## 2023-02-27 DIAGNOSIS — M25661 Stiffness of right knee, not elsewhere classified: Secondary | ICD-10-CM | POA: Diagnosis not present

## 2023-02-27 DIAGNOSIS — M6281 Muscle weakness (generalized): Secondary | ICD-10-CM | POA: Diagnosis not present

## 2023-02-27 DIAGNOSIS — Z96651 Presence of right artificial knee joint: Secondary | ICD-10-CM | POA: Diagnosis not present

## 2023-03-23 DIAGNOSIS — M25661 Stiffness of right knee, not elsewhere classified: Secondary | ICD-10-CM | POA: Diagnosis not present

## 2023-03-23 DIAGNOSIS — Z96651 Presence of right artificial knee joint: Secondary | ICD-10-CM | POA: Diagnosis not present

## 2023-03-23 DIAGNOSIS — M6281 Muscle weakness (generalized): Secondary | ICD-10-CM | POA: Diagnosis not present

## 2023-03-24 DIAGNOSIS — M25661 Stiffness of right knee, not elsewhere classified: Secondary | ICD-10-CM | POA: Diagnosis not present

## 2023-03-28 DIAGNOSIS — M25661 Stiffness of right knee, not elsewhere classified: Secondary | ICD-10-CM | POA: Diagnosis not present

## 2023-03-28 DIAGNOSIS — M6281 Muscle weakness (generalized): Secondary | ICD-10-CM | POA: Diagnosis not present

## 2023-03-28 DIAGNOSIS — Z96651 Presence of right artificial knee joint: Secondary | ICD-10-CM | POA: Diagnosis not present

## 2023-03-30 DIAGNOSIS — Z96651 Presence of right artificial knee joint: Secondary | ICD-10-CM | POA: Diagnosis not present

## 2023-03-30 DIAGNOSIS — M6281 Muscle weakness (generalized): Secondary | ICD-10-CM | POA: Diagnosis not present

## 2023-03-30 DIAGNOSIS — M25661 Stiffness of right knee, not elsewhere classified: Secondary | ICD-10-CM | POA: Diagnosis not present

## 2023-04-07 DIAGNOSIS — Z96651 Presence of right artificial knee joint: Secondary | ICD-10-CM | POA: Diagnosis not present

## 2023-04-07 DIAGNOSIS — M25661 Stiffness of right knee, not elsewhere classified: Secondary | ICD-10-CM | POA: Diagnosis not present

## 2023-04-07 DIAGNOSIS — M6281 Muscle weakness (generalized): Secondary | ICD-10-CM | POA: Diagnosis not present

## 2023-04-10 DIAGNOSIS — M6281 Muscle weakness (generalized): Secondary | ICD-10-CM | POA: Diagnosis not present

## 2023-04-10 DIAGNOSIS — Z96651 Presence of right artificial knee joint: Secondary | ICD-10-CM | POA: Diagnosis not present

## 2023-04-10 DIAGNOSIS — M25661 Stiffness of right knee, not elsewhere classified: Secondary | ICD-10-CM | POA: Diagnosis not present

## 2023-04-12 DIAGNOSIS — M6281 Muscle weakness (generalized): Secondary | ICD-10-CM | POA: Diagnosis not present

## 2023-04-12 DIAGNOSIS — Z96651 Presence of right artificial knee joint: Secondary | ICD-10-CM | POA: Diagnosis not present

## 2023-04-12 DIAGNOSIS — M25661 Stiffness of right knee, not elsewhere classified: Secondary | ICD-10-CM | POA: Diagnosis not present

## 2023-04-25 DIAGNOSIS — Z96651 Presence of right artificial knee joint: Secondary | ICD-10-CM | POA: Diagnosis not present

## 2023-04-25 DIAGNOSIS — M25661 Stiffness of right knee, not elsewhere classified: Secondary | ICD-10-CM | POA: Diagnosis not present

## 2023-04-25 DIAGNOSIS — M6281 Muscle weakness (generalized): Secondary | ICD-10-CM | POA: Diagnosis not present

## 2023-04-26 DIAGNOSIS — M25661 Stiffness of right knee, not elsewhere classified: Secondary | ICD-10-CM | POA: Diagnosis not present

## 2023-04-27 DIAGNOSIS — Z96651 Presence of right artificial knee joint: Secondary | ICD-10-CM | POA: Diagnosis not present

## 2023-04-27 DIAGNOSIS — M2681 Anterior soft tissue impingement: Secondary | ICD-10-CM | POA: Diagnosis not present

## 2023-04-27 DIAGNOSIS — M25661 Stiffness of right knee, not elsewhere classified: Secondary | ICD-10-CM | POA: Diagnosis not present

## 2023-05-03 DIAGNOSIS — Z96651 Presence of right artificial knee joint: Secondary | ICD-10-CM | POA: Diagnosis not present

## 2023-05-03 DIAGNOSIS — M25661 Stiffness of right knee, not elsewhere classified: Secondary | ICD-10-CM | POA: Diagnosis not present

## 2023-05-03 DIAGNOSIS — M6281 Muscle weakness (generalized): Secondary | ICD-10-CM | POA: Diagnosis not present

## 2023-05-05 DIAGNOSIS — Z96651 Presence of right artificial knee joint: Secondary | ICD-10-CM | POA: Diagnosis not present

## 2023-05-05 DIAGNOSIS — M25661 Stiffness of right knee, not elsewhere classified: Secondary | ICD-10-CM | POA: Diagnosis not present

## 2023-05-05 DIAGNOSIS — M6281 Muscle weakness (generalized): Secondary | ICD-10-CM | POA: Diagnosis not present

## 2023-05-09 DIAGNOSIS — Z96651 Presence of right artificial knee joint: Secondary | ICD-10-CM | POA: Diagnosis not present

## 2023-05-09 DIAGNOSIS — M6281 Muscle weakness (generalized): Secondary | ICD-10-CM | POA: Diagnosis not present

## 2023-05-09 DIAGNOSIS — M25661 Stiffness of right knee, not elsewhere classified: Secondary | ICD-10-CM | POA: Diagnosis not present

## 2023-05-11 DIAGNOSIS — M25661 Stiffness of right knee, not elsewhere classified: Secondary | ICD-10-CM | POA: Diagnosis not present

## 2023-05-11 DIAGNOSIS — M6281 Muscle weakness (generalized): Secondary | ICD-10-CM | POA: Diagnosis not present

## 2023-05-11 DIAGNOSIS — Z96651 Presence of right artificial knee joint: Secondary | ICD-10-CM | POA: Diagnosis not present

## 2023-05-16 DIAGNOSIS — M6281 Muscle weakness (generalized): Secondary | ICD-10-CM | POA: Diagnosis not present

## 2023-05-16 DIAGNOSIS — Z96651 Presence of right artificial knee joint: Secondary | ICD-10-CM | POA: Diagnosis not present

## 2023-05-16 DIAGNOSIS — M25661 Stiffness of right knee, not elsewhere classified: Secondary | ICD-10-CM | POA: Diagnosis not present

## 2023-05-23 DIAGNOSIS — Z96651 Presence of right artificial knee joint: Secondary | ICD-10-CM | POA: Diagnosis not present

## 2023-05-23 DIAGNOSIS — M6281 Muscle weakness (generalized): Secondary | ICD-10-CM | POA: Diagnosis not present

## 2023-05-23 DIAGNOSIS — M25661 Stiffness of right knee, not elsewhere classified: Secondary | ICD-10-CM | POA: Diagnosis not present

## 2023-05-25 DIAGNOSIS — M6281 Muscle weakness (generalized): Secondary | ICD-10-CM | POA: Diagnosis not present

## 2023-05-25 DIAGNOSIS — Z96651 Presence of right artificial knee joint: Secondary | ICD-10-CM | POA: Diagnosis not present

## 2023-05-25 DIAGNOSIS — M25661 Stiffness of right knee, not elsewhere classified: Secondary | ICD-10-CM | POA: Diagnosis not present

## 2023-05-30 DIAGNOSIS — M6281 Muscle weakness (generalized): Secondary | ICD-10-CM | POA: Diagnosis not present

## 2023-05-30 DIAGNOSIS — M25661 Stiffness of right knee, not elsewhere classified: Secondary | ICD-10-CM | POA: Diagnosis not present

## 2023-05-30 DIAGNOSIS — Z96651 Presence of right artificial knee joint: Secondary | ICD-10-CM | POA: Diagnosis not present

## 2023-06-01 DIAGNOSIS — Z96651 Presence of right artificial knee joint: Secondary | ICD-10-CM | POA: Diagnosis not present

## 2023-06-01 DIAGNOSIS — M25661 Stiffness of right knee, not elsewhere classified: Secondary | ICD-10-CM | POA: Diagnosis not present

## 2023-06-01 DIAGNOSIS — M6281 Muscle weakness (generalized): Secondary | ICD-10-CM | POA: Diagnosis not present

## 2023-06-06 DIAGNOSIS — M6281 Muscle weakness (generalized): Secondary | ICD-10-CM | POA: Diagnosis not present

## 2023-06-06 DIAGNOSIS — Z96651 Presence of right artificial knee joint: Secondary | ICD-10-CM | POA: Diagnosis not present

## 2023-06-06 DIAGNOSIS — M25661 Stiffness of right knee, not elsewhere classified: Secondary | ICD-10-CM | POA: Diagnosis not present

## 2023-06-08 DIAGNOSIS — M25661 Stiffness of right knee, not elsewhere classified: Secondary | ICD-10-CM | POA: Diagnosis not present

## 2023-06-08 DIAGNOSIS — Z96651 Presence of right artificial knee joint: Secondary | ICD-10-CM | POA: Diagnosis not present

## 2023-06-08 DIAGNOSIS — M6281 Muscle weakness (generalized): Secondary | ICD-10-CM | POA: Diagnosis not present

## 2023-06-13 DIAGNOSIS — M25661 Stiffness of right knee, not elsewhere classified: Secondary | ICD-10-CM | POA: Diagnosis not present

## 2023-06-13 DIAGNOSIS — Z96651 Presence of right artificial knee joint: Secondary | ICD-10-CM | POA: Diagnosis not present

## 2023-06-13 DIAGNOSIS — M6281 Muscle weakness (generalized): Secondary | ICD-10-CM | POA: Diagnosis not present

## 2023-06-15 DIAGNOSIS — M6281 Muscle weakness (generalized): Secondary | ICD-10-CM | POA: Diagnosis not present

## 2023-06-15 DIAGNOSIS — M25661 Stiffness of right knee, not elsewhere classified: Secondary | ICD-10-CM | POA: Diagnosis not present

## 2023-06-15 DIAGNOSIS — Z96651 Presence of right artificial knee joint: Secondary | ICD-10-CM | POA: Diagnosis not present

## 2023-06-21 DIAGNOSIS — Z96651 Presence of right artificial knee joint: Secondary | ICD-10-CM | POA: Diagnosis not present

## 2023-06-21 DIAGNOSIS — M25661 Stiffness of right knee, not elsewhere classified: Secondary | ICD-10-CM | POA: Diagnosis not present

## 2023-06-21 DIAGNOSIS — M6281 Muscle weakness (generalized): Secondary | ICD-10-CM | POA: Diagnosis not present

## 2023-06-23 DIAGNOSIS — M6281 Muscle weakness (generalized): Secondary | ICD-10-CM | POA: Diagnosis not present

## 2023-06-23 DIAGNOSIS — M25661 Stiffness of right knee, not elsewhere classified: Secondary | ICD-10-CM | POA: Diagnosis not present

## 2023-06-23 DIAGNOSIS — Z96651 Presence of right artificial knee joint: Secondary | ICD-10-CM | POA: Diagnosis not present

## 2023-06-27 DIAGNOSIS — M25661 Stiffness of right knee, not elsewhere classified: Secondary | ICD-10-CM | POA: Diagnosis not present

## 2023-06-27 DIAGNOSIS — M6281 Muscle weakness (generalized): Secondary | ICD-10-CM | POA: Diagnosis not present

## 2023-06-27 DIAGNOSIS — Z96651 Presence of right artificial knee joint: Secondary | ICD-10-CM | POA: Diagnosis not present

## 2023-06-29 DIAGNOSIS — Z96651 Presence of right artificial knee joint: Secondary | ICD-10-CM | POA: Diagnosis not present

## 2023-06-29 DIAGNOSIS — M6281 Muscle weakness (generalized): Secondary | ICD-10-CM | POA: Diagnosis not present

## 2023-06-29 DIAGNOSIS — M25661 Stiffness of right knee, not elsewhere classified: Secondary | ICD-10-CM | POA: Diagnosis not present

## 2023-07-04 DIAGNOSIS — M6281 Muscle weakness (generalized): Secondary | ICD-10-CM | POA: Diagnosis not present

## 2023-07-04 DIAGNOSIS — Z96651 Presence of right artificial knee joint: Secondary | ICD-10-CM | POA: Diagnosis not present

## 2023-07-04 DIAGNOSIS — M25661 Stiffness of right knee, not elsewhere classified: Secondary | ICD-10-CM | POA: Diagnosis not present

## 2023-07-11 DIAGNOSIS — M25661 Stiffness of right knee, not elsewhere classified: Secondary | ICD-10-CM | POA: Diagnosis not present

## 2023-07-11 DIAGNOSIS — M6281 Muscle weakness (generalized): Secondary | ICD-10-CM | POA: Diagnosis not present

## 2023-07-11 DIAGNOSIS — Z96651 Presence of right artificial knee joint: Secondary | ICD-10-CM | POA: Diagnosis not present

## 2023-07-13 DIAGNOSIS — Z96651 Presence of right artificial knee joint: Secondary | ICD-10-CM | POA: Diagnosis not present

## 2023-07-13 DIAGNOSIS — M6281 Muscle weakness (generalized): Secondary | ICD-10-CM | POA: Diagnosis not present

## 2023-07-13 DIAGNOSIS — M25661 Stiffness of right knee, not elsewhere classified: Secondary | ICD-10-CM | POA: Diagnosis not present

## 2023-07-18 DIAGNOSIS — M6281 Muscle weakness (generalized): Secondary | ICD-10-CM | POA: Diagnosis not present

## 2023-07-18 DIAGNOSIS — Z96651 Presence of right artificial knee joint: Secondary | ICD-10-CM | POA: Diagnosis not present

## 2023-07-18 DIAGNOSIS — M25661 Stiffness of right knee, not elsewhere classified: Secondary | ICD-10-CM | POA: Diagnosis not present

## 2023-07-25 DIAGNOSIS — Z96651 Presence of right artificial knee joint: Secondary | ICD-10-CM | POA: Diagnosis not present

## 2023-07-25 DIAGNOSIS — M6281 Muscle weakness (generalized): Secondary | ICD-10-CM | POA: Diagnosis not present

## 2023-07-25 DIAGNOSIS — M25661 Stiffness of right knee, not elsewhere classified: Secondary | ICD-10-CM | POA: Diagnosis not present

## 2023-07-27 DIAGNOSIS — Z96651 Presence of right artificial knee joint: Secondary | ICD-10-CM | POA: Diagnosis not present

## 2023-07-27 DIAGNOSIS — M6281 Muscle weakness (generalized): Secondary | ICD-10-CM | POA: Diagnosis not present

## 2023-07-27 DIAGNOSIS — M25661 Stiffness of right knee, not elsewhere classified: Secondary | ICD-10-CM | POA: Diagnosis not present

## 2023-08-02 DIAGNOSIS — M25661 Stiffness of right knee, not elsewhere classified: Secondary | ICD-10-CM | POA: Diagnosis not present

## 2023-08-11 ENCOUNTER — Emergency Department (HOSPITAL_BASED_OUTPATIENT_CLINIC_OR_DEPARTMENT_OTHER)

## 2023-08-11 ENCOUNTER — Other Ambulatory Visit: Payer: Self-pay

## 2023-08-11 ENCOUNTER — Emergency Department (HOSPITAL_BASED_OUTPATIENT_CLINIC_OR_DEPARTMENT_OTHER)
Admission: EM | Admit: 2023-08-11 | Discharge: 2023-08-11 | Disposition: A | Discharge status: Home / Self Care | Attending: Emergency Medicine | Admitting: Emergency Medicine

## 2023-08-11 ENCOUNTER — Encounter (HOSPITAL_BASED_OUTPATIENT_CLINIC_OR_DEPARTMENT_OTHER): Payer: Self-pay

## 2023-08-11 DIAGNOSIS — I10 Essential (primary) hypertension: Secondary | ICD-10-CM | POA: Insufficient documentation

## 2023-08-11 DIAGNOSIS — R519 Headache, unspecified: Secondary | ICD-10-CM | POA: Diagnosis not present

## 2023-08-11 DIAGNOSIS — R079 Chest pain, unspecified: Secondary | ICD-10-CM | POA: Diagnosis not present

## 2023-08-11 DIAGNOSIS — I159 Secondary hypertension, unspecified: Secondary | ICD-10-CM

## 2023-08-11 LAB — TROPONIN T, HIGH SENSITIVITY: Troponin T High Sensitivity: <15 ng/L (ref <19)

## 2023-08-11 LAB — CBC WITH DIFFERENTIAL/PLATELET
Abs Immature Granulocytes: 0.01 10*3/uL (ref 0.00–0.07)
Basophils Absolute: 0 10*3/uL (ref 0.0–0.1)
Basophils Relative: 0 %
Eosinophils Absolute: 0.2 10*3/uL (ref 0.0–0.5)
Eosinophils Relative: 4 %
HCT: 41.7 % (ref 36.0–46.0)
Hemoglobin: 13.9 g/dL (ref 12.0–15.0)
Immature Granulocytes: 0 %
Lymphocytes Relative: 21 %
Lymphs Abs: 1 10*3/uL (ref 0.7–4.0)
MCH: 29.3 pg (ref 26.0–34.0)
MCHC: 33.3 g/dL (ref 30.0–36.0)
MCV: 87.8 fL (ref 80.0–100.0)
Monocytes Absolute: 0.5 10*3/uL (ref 0.1–1.0)
Monocytes Relative: 12 %
Neutro Abs: 2.9 10*3/uL (ref 1.7–7.7)
Neutrophils Relative %: 63 %
Platelets: 281 10*3/uL (ref 150–400)
RBC: 4.75 MIL/uL (ref 3.87–5.11)
RDW: 13 % (ref 11.5–15.5)
WBC: 4.7 10*3/uL (ref 4.0–10.5)
nRBC: 0 % (ref 0.0–0.2)

## 2023-08-11 LAB — BASIC METABOLIC PANEL WITH GFR
Anion gap: 11 (ref 5–15)
BUN: 9 mg/dL (ref 8–23)
CO2: 26 mmol/L (ref 22–32)
Calcium: 9.6 mg/dL (ref 8.9–10.3)
Chloride: 102 mmol/L (ref 98–111)
Creatinine, Ser: 0.71 mg/dL (ref 0.44–1.00)
GFR, Estimated: >60 mL/min (ref >60)
Glucose, Bld: 94 mg/dL (ref 70–99)
Potassium: 4.3 mmol/L (ref 3.5–5.1)
Sodium: 139 mmol/L (ref 135–145)

## 2023-08-11 MED ORDER — AMLODIPINE BESYLATE 5 MG PO TABS
5.0000 mg | ORAL_TABLET | Freq: Once | ORAL | Status: AC
  Filled 2023-08-11: qty 1

## 2023-08-11 MED ORDER — ACETAMINOPHEN 500 MG PO TABS
1000.0000 mg | ORAL_TABLET | Freq: Once | ORAL | Status: AC
  Filled 2023-08-11: qty 2

## 2023-08-11 MED ORDER — AMLODIPINE BESYLATE 5 MG PO TABS: 5.0000 mg | ORAL_TABLET | Freq: Every day | ORAL | 0 refills | Status: AC

## 2023-08-11 NOTE — ED Triage Notes (Signed)
 Hypertension all day, BP as high as 208. Pt does not take blood pressure medications. C/o headache, sharp pain behind left eye. Nausea, sob throughout the day.

## 2023-08-11 NOTE — ED Provider Notes (Signed)
 [place] EMERGENCY DEPARTMENT AT [facility] Provider Note   CSN: [id number] Arrival date & time: 08/11/23  0142     History Chief Complaint  Patient presents with   Hypertension    HPI Sherry Pierce is a 68 y.o. female presenting for chief complaint of headache and high blood pressure. States that her BP has been high for the past 2 days.  No history of similar Diffuse headache  Patient's recorded medical, surgical, social, medication list and allergies were reviewed in the Snapshot window as part of the initial history.   Review of Systems   Review of Systems  Constitutional:  Negative for chills and fever.  HENT:  Negative for ear pain and sore throat.   Eyes:  Negative for pain and visual disturbance.  Respiratory:  Negative for cough and shortness of breath.   Cardiovascular:  Negative for chest pain and palpitations.  Gastrointestinal:  Negative for abdominal pain and vomiting.  Genitourinary:  Negative for dysuria and hematuria.  Musculoskeletal:  Negative for arthralgias and back pain.  Skin:  Negative for color change and rash.  Neurological:  Positive for headaches. Negative for seizures, syncope and light-headedness.  All other systems reviewed and are negative.   Physical Exam Updated Vital Signs BP (!) 175/107   Pulse 98   Temp 98.2 F (36.8 C) (Oral)   Resp 18   Ht 5' 4 (1.626 m)   Wt 72.6 kg   SpO2 94%   BMI 27.46 kg/m  Physical Exam Vitals and nursing note reviewed.  Constitutional:      General: She is not in acute distress.    Appearance: She is well-developed.  HENT:     Head: Normocephalic and atraumatic.   Eyes:     Conjunctiva/sclera: Conjunctivae normal.    Cardiovascular:     Rate and Rhythm: Normal rate and regular rhythm.     Heart sounds: No murmur heard. Pulmonary:     Effort: Pulmonary effort is normal. No respiratory distress.     Breath sounds: Normal breath sounds.  Abdominal:     General: There is no  distension.     Palpations: Abdomen is soft.     Tenderness: There is no abdominal tenderness. There is no right CVA tenderness or left CVA tenderness.   Musculoskeletal:        General: No swelling or tenderness. Normal range of motion.     Cervical back: Neck supple.   Skin:    General: Skin is warm and dry.   Neurological:     General: No focal deficit present.     Mental Status: She is alert and oriented to person, place, and time. Mental status is at baseline.     Cranial Nerves: No cranial nerve deficit.      ED Course/ Medical Decision Making/ A&P    Procedures Procedures   Medications Ordered in ED Medications  amLODipine (NORVASC) tablet 5 mg (has no administration in time range)  acetaminophen  (TYLENOL ) tablet 1,000 mg (has no administration in time range)   Medical Decision Making:   [name] is a 68 y.o. female who presented to the ED today with fatigue and malaise and headache and high blood pressure detailed above.    Additional history discussed with patient's family/caregivers.  Patient placed on continuous vitals and telemetry monitoring while in ED which was reviewed periodically.  Complete initial physical exam performed, notably the patient  was HDS in NAD.    Reviewed and confirmed  nursing documentation for past medical history, family history, social history.    Initial Assessment:   With the patient's presentation of elevated blood pressure readings, most likely diagnosis is hypertensive urgency. Other diagnoses associated with hypertensive emergency were considered including (but not limited to) intracranial hemorrhage, acute renal artery stenosis, acute kidney injury, myocardial stress, ophthalmologic emergencies. These are considered less likely due to history of present illness and physical exam findings.   This is most consistent with an acute life/limb threatening illness complicated by underlying chronic conditions. Will evaluate for  hypertensive emergency as below.  Initial Plan:  Screening labs including CBC and Metabolic panel to evaluate for infectious or metabolic etiology of disease.  CXR to evaluate for structural/infectious intrathoracic pathology.  Given headache, eval for ICH with CTH EKG and single troponin to evaluate for cardiac pathology. Single troponin appropriate due to greater than 6 hours since maximal intensity of symptoms. Objective evaluation as below reviewed. Considered further administration of antihypertensives in ED, per consensus guidelines for [facility] of emergency physicians, acute treatment of hypertensive urgency alone in the emergency department is not recommended.  If patient has evidence of hypertensive emergency on objective laboratory evaluation, will reevaluate.  Will monitor blood pressure while patient awaiting above laboratory studies.  Initial Study Results:   Laboratory  All laboratory results reviewed without evidence of clinically relevant pathology.    EKG EKG was reviewed independently. Rate, rhythm, axis, intervals all examined and without medically relevant abnormality. ST segments without concerns for elevations.    Radiology:  All images reviewed independently. Agree with radiology report at this time.   CT HEAD WO CONTRAST ( ) Result Date: 08/11/2023 CLINICAL DATA:  New onset headache. Hypertension with systolic pressures as high as 161 EXAM: CT HEAD WITHOUT CONTRAST TECHNIQUE: Contiguous axial images were obtained from the base of the skull through the vertex without intravenous contrast. RADIATION DOSE REDUCTION: This exam was performed according to the departmental dose-optimization program which includes automated exposure control, adjustment of the mA and/or kV according to patient size and/or use of iterative reconstruction technique. COMPARISON:  CT 12/11/2020 FINDINGS: Brain: No intracranial hemorrhage, mass effect, or evidence of acute infarct. No  hydrocephalus. No extra-axial fluid collection. Vascular: No hyperdense vessel or unexpected calcification. Skull: No fracture or focal lesion. Sinuses/Orbits: Mucosal thickening throughout the paranasal sinus greatest in the ethmoid air cells. No mastoid effusion. Globes are intact. Other: None. IMPRESSION: 1. No acute intracranial abnormality. 2. Mucosal thickening throughout the paranasal sinus greatest in the ethmoid air cells. Electronically Signed   By: Rozell Cornet M.D.   On: 08/11/2023 02:40   DG Chest Portable 1 View Result Date: 08/11/2023 CLINICAL DATA:  Chest pain EXAM: PORTABLE CHEST 1 VIEW COMPARISON:  12/10/2020 FINDINGS: The heart size and mediastinal contours are within normal limits. Both lungs are clear. The visualized skeletal structures are unremarkable. Postsurgical changes in the cervical spine are seen. IMPRESSION: No active disease. Electronically Signed   By: Violeta Grey M.D.   On: 08/11/2023 02:15    Reassessment and Plan:   Labwork without any acute pathology. She is well appearing. Will start on amlodipine and refer back to PCP for definitive care.   Disposition:  I have considered need for hospitalization, however, considering all of the above, I believe this patient is stable for discharge at this time.  Patient/family educated about specific return precautions for given chief complaint and symptoms.  Patient/family educated about follow-up with PCP.      Patient/family expressed understanding  of return precautions and need for follow-up. Patient spoken to regarding all imaging and laboratory results and appropriate follow up for these results. All education provided in verbal form with additional information in written form. Time was allowed for answering of patient questions. Patient discharged.    Emergency Department Medication Summary:   Medications  amLODipine (NORVASC) tablet 5 mg (has no administration in time range)  acetaminophen  (TYLENOL ) tablet  1,000 mg (has no administration in time range)         Clinical Impression:  1. Acute nonintractable headache, unspecified headache type      Data Unavailable   Final Clinical Impression(s) / ED Diagnoses Final diagnoses:  Acute nonintractable headache, unspecified headache type    Rx / DC Orders ED Discharge Orders          Ordered    amLODipine (NORVASC) 5 MG tablet  Daily        08/11/23 0330              Onetha Bile, MD 08/11/23 [phone]

## 2023-08-17 DIAGNOSIS — I10 Essential (primary) hypertension: Secondary | ICD-10-CM | POA: Diagnosis not present

## 2023-08-17 DIAGNOSIS — G44209 Tension-type headache, unspecified, not intractable: Secondary | ICD-10-CM | POA: Diagnosis not present

## 2023-08-23 DIAGNOSIS — Z96651 Presence of right artificial knee joint: Secondary | ICD-10-CM | POA: Diagnosis not present

## 2023-08-23 DIAGNOSIS — M6281 Muscle weakness (generalized): Secondary | ICD-10-CM | POA: Diagnosis not present

## 2023-08-23 DIAGNOSIS — M25661 Stiffness of right knee, not elsewhere classified: Secondary | ICD-10-CM | POA: Diagnosis not present

## 2023-08-30 DIAGNOSIS — M25661 Stiffness of right knee, not elsewhere classified: Secondary | ICD-10-CM | POA: Diagnosis not present

## 2023-08-30 DIAGNOSIS — M6281 Muscle weakness (generalized): Secondary | ICD-10-CM | POA: Diagnosis not present

## 2023-08-30 DIAGNOSIS — Z96651 Presence of right artificial knee joint: Secondary | ICD-10-CM | POA: Diagnosis not present

## 2023-09-06 DIAGNOSIS — Z96651 Presence of right artificial knee joint: Secondary | ICD-10-CM | POA: Diagnosis not present

## 2023-09-06 DIAGNOSIS — M6281 Muscle weakness (generalized): Secondary | ICD-10-CM | POA: Diagnosis not present

## 2023-09-06 DIAGNOSIS — M25661 Stiffness of right knee, not elsewhere classified: Secondary | ICD-10-CM | POA: Diagnosis not present

## 2023-09-19 DIAGNOSIS — I10 Essential (primary) hypertension: Secondary | ICD-10-CM | POA: Diagnosis not present

## 2023-09-20 DIAGNOSIS — M25661 Stiffness of right knee, not elsewhere classified: Secondary | ICD-10-CM | POA: Diagnosis not present

## 2023-09-20 DIAGNOSIS — M6281 Muscle weakness (generalized): Secondary | ICD-10-CM | POA: Diagnosis not present

## 2023-09-20 DIAGNOSIS — Z96651 Presence of right artificial knee joint: Secondary | ICD-10-CM | POA: Diagnosis not present

## 2023-12-14 DIAGNOSIS — I10 Essential (primary) hypertension: Secondary | ICD-10-CM | POA: Diagnosis not present

## 2023-12-14 DIAGNOSIS — M899 Disorder of bone, unspecified: Secondary | ICD-10-CM | POA: Diagnosis not present

## 2023-12-14 DIAGNOSIS — E039 Hypothyroidism, unspecified: Secondary | ICD-10-CM | POA: Diagnosis not present

## 2023-12-14 DIAGNOSIS — D72819 Decreased white blood cell count, unspecified: Secondary | ICD-10-CM | POA: Diagnosis not present

## 2023-12-20 DIAGNOSIS — Z23 Encounter for immunization: Secondary | ICD-10-CM | POA: Diagnosis not present

## 2023-12-20 DIAGNOSIS — I700 Atherosclerosis of aorta: Secondary | ICD-10-CM | POA: Diagnosis not present

## 2023-12-20 DIAGNOSIS — F988 Other specified behavioral and emotional disorders with onset usually occurring in childhood and adolescence: Secondary | ICD-10-CM | POA: Diagnosis not present

## 2023-12-20 DIAGNOSIS — Z0000 Encounter for general adult medical examination without abnormal findings: Secondary | ICD-10-CM | POA: Diagnosis not present

## 2023-12-20 DIAGNOSIS — I10 Essential (primary) hypertension: Secondary | ICD-10-CM | POA: Diagnosis not present

## 2023-12-20 DIAGNOSIS — Z6827 Body mass index (BMI) 27.0-27.9, adult: Secondary | ICD-10-CM | POA: Diagnosis not present

## 2023-12-20 DIAGNOSIS — L570 Actinic keratosis: Secondary | ICD-10-CM | POA: Diagnosis not present

## 2023-12-20 DIAGNOSIS — E039 Hypothyroidism, unspecified: Secondary | ICD-10-CM | POA: Diagnosis not present

## 2023-12-20 DIAGNOSIS — Z96651 Presence of right artificial knee joint: Secondary | ICD-10-CM | POA: Diagnosis not present

## 2023-12-22 ENCOUNTER — Other Ambulatory Visit (HOSPITAL_COMMUNITY): Payer: Self-pay | Admitting: Orthopaedic Surgery

## 2023-12-22 DIAGNOSIS — Z96651 Presence of right artificial knee joint: Secondary | ICD-10-CM

## 2023-12-26 ENCOUNTER — Encounter (HOSPITAL_COMMUNITY)
Admission: RE | Admit: 2023-12-26 | Discharge: 2023-12-26 | Disposition: A | Discharge status: Home / Self Care | Source: Ambulatory Visit | Attending: Orthopaedic Surgery | Admitting: Orthopaedic Surgery

## 2023-12-26 ENCOUNTER — Encounter (HOSPITAL_COMMUNITY): Payer: Self-pay

## 2023-12-26 DIAGNOSIS — M1712 Unilateral primary osteoarthritis, left knee: Secondary | ICD-10-CM | POA: Diagnosis not present

## 2023-12-26 DIAGNOSIS — M7989 Other specified soft tissue disorders: Secondary | ICD-10-CM | POA: Diagnosis not present

## 2023-12-26 DIAGNOSIS — Z96651 Presence of right artificial knee joint: Secondary | ICD-10-CM | POA: Insufficient documentation

## 2023-12-26 DIAGNOSIS — M25561 Pain in right knee: Secondary | ICD-10-CM | POA: Diagnosis not present

## 2023-12-26 MED ORDER — TECHNETIUM TC 99M MEDRONATE IV KIT
20.0000 | PACK | Freq: Once | INTRAVENOUS | Status: AC | PRN
  Administered 2023-12-26: 21.8 via INTRAVENOUS

## 2024-01-03 DIAGNOSIS — M25561 Pain in right knee: Secondary | ICD-10-CM | POA: Diagnosis not present

## 2024-01-19 DIAGNOSIS — H00022 Hordeolum internum right lower eyelid: Secondary | ICD-10-CM | POA: Diagnosis not present

## 2024-01-19 DIAGNOSIS — Z23 Encounter for immunization: Secondary | ICD-10-CM | POA: Diagnosis not present

## 2024-01-30 DIAGNOSIS — M8589 Other specified disorders of bone density and structure, multiple sites: Secondary | ICD-10-CM | POA: Diagnosis not present
